# Patient Record
Sex: Female | Born: 1955
Health system: Southern US, Community
[De-identification: ages and names within clinical notes are randomized; demographics above are authoritative.]

## PROBLEM LIST (undated history)

## (undated) DIAGNOSIS — T7840XA Allergy, unspecified, initial encounter: Secondary | ICD-10-CM

## (undated) DIAGNOSIS — E785 Hyperlipidemia, unspecified: Secondary | ICD-10-CM

## (undated) DIAGNOSIS — F419 Anxiety disorder, unspecified: Secondary | ICD-10-CM

## (undated) DIAGNOSIS — A159 Respiratory tuberculosis unspecified: Secondary | ICD-10-CM

## (undated) HISTORY — DX: Allergy, unspecified, initial encounter: T78.40XA

## (undated) HISTORY — DX: Anxiety disorder, unspecified: F41.9

## (undated) HISTORY — PX: ABDOMINAL HYSTERECTOMY: SHX81

## (undated) HISTORY — DX: Respiratory tuberculosis unspecified: A15.9

## (undated) HISTORY — PX: TONSILLECTOMY: SUR1361

## (undated) HISTORY — DX: Hyperlipidemia, unspecified: E78.5

---

## 2006-12-13 ENCOUNTER — Ambulatory Visit: Payer: Self-pay | Admitting: Family Medicine

## 2011-12-09 ENCOUNTER — Ambulatory Visit: Payer: Self-pay | Admitting: Family Medicine

## 2017-03-21 ENCOUNTER — Ambulatory Visit
Admission: RE | Admit: 2017-03-21 | Discharge: 2017-03-21 | Disposition: A | Discharge status: Home / Self Care | Payer: Managed Care, Other (non HMO) | Source: Ambulatory Visit | Attending: Physician Assistant | Admitting: Physician Assistant

## 2017-03-21 ENCOUNTER — Encounter: Payer: Self-pay | Admitting: Physician Assistant

## 2017-03-21 ENCOUNTER — Ambulatory Visit (INDEPENDENT_AMBULATORY_CARE_PROVIDER_SITE_OTHER): Payer: Managed Care, Other (non HMO) | Admitting: Physician Assistant

## 2017-03-21 VITALS — BP 132/68 | HR 95 | Resp 16 | Ht 63.5 in | Wt 132.0 lb

## 2017-03-21 DIAGNOSIS — R21 Rash and other nonspecific skin eruption: Secondary | ICD-10-CM

## 2017-03-21 DIAGNOSIS — Z8611 Personal history of tuberculosis: Secondary | ICD-10-CM

## 2017-03-21 DIAGNOSIS — N83209 Unspecified ovarian cyst, unspecified side: Secondary | ICD-10-CM | POA: Insufficient documentation

## 2017-03-21 DIAGNOSIS — A159 Respiratory tuberculosis unspecified: Secondary | ICD-10-CM | POA: Insufficient documentation

## 2017-03-21 DIAGNOSIS — R509 Fever, unspecified: Secondary | ICD-10-CM | POA: Diagnosis not present

## 2017-03-21 DIAGNOSIS — G4700 Insomnia, unspecified: Secondary | ICD-10-CM | POA: Insufficient documentation

## 2017-03-21 MED ORDER — DOXYCYCLINE HYCLATE 100 MG PO TABS: 100.0000 mg | ORAL_TABLET | Freq: Two times a day (BID) | ORAL | 0 refills | Status: DC

## 2017-03-21 MED ORDER — DOXYCYCLINE MONOHYDRATE 25 MG/5ML PO SUSR: 100.0000 mg | Freq: Two times a day (BID) | ORAL | 0 refills | Status: AC

## 2017-03-21 NOTE — Patient Instructions (Signed)
Tuberculosis Tuberculosis (TB) is an infection that harms body tissues. TB usually affects your lungs but can affect other parts of your body. There are two stages of TB:  Active TB. This means you have the symptoms of TB, and it can easily spread from person to person (contagious).  Latent TB. This means you do not have any symptoms of TB, and you are not contagious.  It is important to get treatment no matter what type of TB you have. Follow these instructions at home:  Take your antibiotic medicine as told by your doctor. Finish it all even if you start to feel better.  Keep all follow-up visits as told by your doctor. This is important.  Tell your doctor about all of the people you live with or with whom you have close contact. Your doctor or the Department of Health may talk to these people about being tested for TB.  Rest as needed.  Do not use any tobacco products, including cigarettes, chewing tobacco, or electronic cigarettes. If you need help quitting, ask your doctor.  Avoid close contact with others, especially infants and older people. Do this until your doctor says you will no longer spread TB.  Cover your mouth and nose when you cough or sneeze. Get rid of used tissues as told by your doctor.  Wash your hands often with soap and water.  Do not go back to work or school until your doctor says it is okay. Contact a doctor if:  You have new symptoms.  You are not hungry.  You feel sick to your stomach (nauseous) or throw up (vomit).  Your pee (urine) is dark yellow.  Your skin or the white part of your eyes turns a yellowish color.  Your symptoms do not go away or get worse.  You have a fever. Get help right away if:  You have chest pain.  You cough up blood.  You have trouble breathing or feel short of breath.  You have a headache or stiff neck. This information is not intended to replace advice given to you by your health care provider. Make sure you  discuss any questions you have with your health care provider. Document Released: 06/12/2009 Document Revised: 04/17/2016 Document Reviewed: 11/20/2013 Elsevier Interactive Patient Education  2018 Elsevier Inc.  

## 2017-03-21 NOTE — Progress Notes (Signed)
Patient: Terri Weaver Female    DOB: 1956/05/14   61 y.o.   MRN: [id number] Visit Date: 03/21/2017  Today's Provider: Trinna Post, PA-C   Chief Complaint  Patient presents with  . Rash   Subjective:      Terri Weaver is a 61 y/o woman with history of TB in 1993 presenting today with rash ongoing for 5 days. She says the rash appeared suddenly as red splotches. It is not itchy or painful. It is all over her body. Is not on her palms or soles. No new lotions or skin products. She has not had any URI symptoms, urinary symptoms. She did have a fever of 102.6 x 3 days earlier this week. No cough, night sweats, chills. No myalgias right now[place] not [place] tick bite.  Rash  This is a new problem. The current episode started in the past 7 days. The problem has been gradually worsening since onset. The rash is diffuse. The rash is characterized by redness. She was exposed to nothing. Associated symptoms include a fever (Pt did have a temp of 102.6 started July 10 and lasted for three days.  Has not had a temp since.). Pertinent negatives include no fatigue.       Allergies  Allergen Reactions  . Chantix  [Varenicline] Itching  . Penicillins      Current Outpatient Prescriptions:  .  Calcium-Vitamin D-Vitamin K 500-100-40 MG-UNT-MCG CHEW, Chew by mouth., Disp: , Rfl:  .  loratadine (CLARITIN) 10 MG tablet, Take by mouth., Disp: , Rfl:  .  Multiple Vitamins-Minerals (MULTIVITAMIN GUMMIES ADULT) CHEW, Chew by mouth., Disp: , Rfl:   Review of Systems  Constitutional: Positive for fever (Pt did have a temp of 102.6 started July 10 and lasted for three days.  Has not had a temp since.). Negative for activity change, appetite change, chills, diaphoresis, fatigue and unexpected weight change.  HENT: Negative for trouble swallowing (   ).   Respiratory: Negative.   Cardiovascular: Negative.   Gastrointestinal: Negative.     Musculoskeletal: Negative for arthralgias, back pain, gait problem, joint swelling, myalgias, neck pain and neck stiffness.  Skin: Positive for rash. Negative for color change, pallor and wound.  Neurological: Negative for dizziness, light-headedness and headaches.  Hematological: Does not bruise/bleed easily.    Social History  Substance Use Topics  . Smoking status: Current Every Day Smoker    Types: Cigarettes  . Smokeless tobacco: Never Used  . Alcohol use No   Objective:   BP 132/68 (BP Location: Right Arm, Patient Position: Sitting, Cuff Size: Normal)   Pulse 95   Resp 16   Ht 5' 3.5" (1.613 m)   Wt 132 lb (59.9 kg)   SpO2 97%   BMI 23.02 kg/m  Vitals:   03/21/17 1109  BP: 132/68  Pulse: 95  Resp: 16  SpO2: 97%  Weight: 132 lb (59.9 kg)  Height: 5' 3.5" (1.613 m)     Physical Exam  Constitutional: She is oriented to person, place, and time. She appears well-developed and well-nourished.  Neck: Neck supple.  Cardiovascular: Normal rate and regular rhythm.   Pulmonary/Chest: Effort normal and breath sounds normal.  Lymphadenopathy:    She has no cervical adenopathy.  Neurological: She is alert and oriented to person, place, and time.  Skin: Skin is warm and dry. Rash noted. There is erythema.  Erythematous lesions distributed across all extremities and  trunk. Spare palms and soles. They are macular, blanchable, and nontender to palpation.  Psychiatric: She has a normal mood and affect. Her behavior is normal.   Media Information     Document Information   Photos  Right arm  03/21/2017 11:32  Attached To:  Office Visit on 03/21/17 with Trinna Post, PA-C  Source Information   Paulene Floor  [facility]   Media Information     Document Information   Photos  Back  03/21/2017 11:32  Attached To:  Office Visit on 03/21/17 with Trinna Post, PA-C  Source Information   Paulene Floor  [facility]    Media Information     Document Information   Photos  Left leg  03/21/2017 11:32  Attached To:  Office Visit on 03/21/17 with Trinna Post, PA-C  Source Information   Paulene Floor  [facility]         Assessment & Plan:     1. Skin rash  Not nodular, tender, or vasculitic in appearance like erythema nodosum. Possibly erythema migrans 2/2 tickborne illness. Did have fever, and does have history of TB. Will evaluate as below and cover for doxycycline for tick borne illness while labs are pending. Patient works in [employer] care, work note provided until labs come back.   - Comprehensive metabolic panel - CBC with Differential/Platelet - HIV antibody (with reflex) - Rocky mtn spotted fvr abs pnl(IgG+IgM) - Lyme Ab/Western Blot Reflex - DG Chest 2 View; Future - Quantiferon tb gold assay - doxycycline (VIBRAMYCIN) 25 MG/5ML SUSR; Take 20 mLs (100 mg total) by mouth 2 (two) times daily.  Dispense: 400 mL; Refill: 0  2. History of TB (tuberculosis)  - Comprehensive metabolic panel - CBC with Differential/Platelet - HIV antibody (with reflex) - Rocky mtn spotted fvr abs pnl(IgG+IgM) - Lyme Ab/Western Blot Reflex - DG Chest 2 View; Future - Quantiferon tb gold assay  3. Fever, unspecified fever cause  - Quantiferon tb gold assay - doxycycline (VIBRAMYCIN) 25 MG/5ML SUSR; Take 20 mLs (100 mg total) by mouth 2 (two) times daily.  Dispense: 400 mL; Refill: 0  Return if symptoms worsen or fail to improve.  The entirety of the information documented in the History of Present Illness, Review of Systems and Physical Exam were personally obtained by me. Portions of this information were initially documented by Ashley Royalty, CMA and reviewed by me for thoroughness and accuracy.   I have spent 25 minutes with this patient, >50% of which was spent on counseling and coordination of care.      Trinna Post, PA-C  [facility]

## 2017-03-24 ENCOUNTER — Other Ambulatory Visit: Payer: Self-pay | Admitting: Physician Assistant

## 2017-03-24 DIAGNOSIS — R21 Rash and other nonspecific skin eruption: Secondary | ICD-10-CM

## 2017-03-24 NOTE — Progress Notes (Signed)
Referral placed. If patient provides an email and wants to activate her mychart, I can see if I can send the pictures I took in clinic to her if that would be helpful.

## 2017-03-24 NOTE — Progress Notes (Signed)
Patient advised. Her email is [email or web address] and I sent her text to activate mychart.

## 2017-03-25 LAB — LYME, WESTERN BLOT, SERUM (REFLEXED)
IgG P18 Ab.: ABSENT
IgG P28 Ab.: ABSENT
IgG P30 Ab.: ABSENT
IgG P39 Ab.: ABSENT
IgG P45 Ab.: ABSENT
IgG P58 Ab.: ABSENT
IgG P66 Ab.: ABSENT
IgG P93 Ab.: ABSENT
Lyme IgG Wb: NEGATIVE
Lyme IgM Wb: POSITIVE — AB

## 2017-03-25 LAB — COMPREHENSIVE METABOLIC PANEL
ALT: 13 IU/L (ref 0–32)
AST: 18 IU/L (ref 0–40)
Albumin/Globulin Ratio: 1.7 (ref 1.2–2.2)
Albumin: 4.3 g/dL (ref 3.5–5.5)
Alkaline Phosphatase: 89 IU/L (ref 39–117)
BUN/Creatinine Ratio: 17 (ref 9–23)
BUN: 12 mg/dL (ref 6–24)
Bilirubin Total: 0.3 mg/dL (ref 0.0–1.2)
CO2: 25 mmol/L (ref 20–29)
Calcium: 9.4 mg/dL (ref 8.7–10.2)
Chloride: 102 mmol/L (ref 96–106)
Creatinine, Ser: 0.71 mg/dL (ref 0.57–1.00)
GFR calc Af Amer: 108 mL/min/{1.73_m2} (ref >59)
GFR calc non Af Amer: 94 mL/min/{1.73_m2} (ref >59)
Globulin, Total: 2.5 g/dL (ref 1.5–4.5)
Glucose: 101 mg/dL — ABNORMAL HIGH (ref 65–99)
Potassium: 4.7 mmol/L (ref 3.5–5.2)
Sodium: 141 mmol/L (ref 134–144)
Total Protein: 6.8 g/dL (ref 6.0–8.5)

## 2017-03-25 LAB — CBC WITH DIFFERENTIAL/PLATELET
Basophils Absolute: 0.1 10*3/uL (ref 0.0–0.2)
Basos: 1 %
EOS (ABSOLUTE): 0.1 10*3/uL (ref 0.0–0.4)
Eos: 1 %
Hematocrit: 41.1 % (ref 34.0–46.6)
Hemoglobin: 13.8 g/dL (ref 11.1–15.9)
Immature Grans (Abs): 0 10*3/uL (ref 0.0–0.1)
Immature Granulocytes: 0 %
Lymphocytes Absolute: 1.7 10*3/uL (ref 0.7–3.1)
Lymphs: 20 %
MCH: 31.5 pg (ref 26.6–33.0)
MCHC: 33.6 g/dL (ref 31.5–35.7)
MCV: 94 fL (ref 79–97)
Monocytes Absolute: 0.7 10*3/uL (ref 0.1–0.9)
Monocytes: 9 %
Neutrophils Absolute: 5.9 10*3/uL (ref 1.4–7.0)
Neutrophils: 69 %
Platelets: 375 10*3/uL (ref 150–379)
RBC: 4.38 x10E6/uL (ref 3.77–5.28)
RDW: 14 % (ref 12.3–15.4)
WBC: 8.5 10*3/uL (ref 3.4–10.8)

## 2017-03-25 LAB — QUANTIFERON IN TUBE
QFT TB AG MINUS NIL VALUE: 0 IU/mL
QUANTIFERON MITOGEN VALUE: >10 IU/mL
QUANTIFERON TB AG VALUE: 0.13 IU/mL
QUANTIFERON TB GOLD: NEGATIVE
Quantiferon Nil Value: 0.13 IU/mL

## 2017-03-25 LAB — LYME AB/WESTERN BLOT REFLEX
LYME DISEASE AB, QUANT, IGM: 8.09 index — ABNORMAL HIGH (ref 0.00–0.79)
Lyme IgG/IgM Ab: 2.17 {ISR} — ABNORMAL HIGH (ref 0.00–0.90)

## 2017-03-25 LAB — ROCKY MTN SPOTTED FVR ABS PNL(IGG+IGM)
RMSF IgG: NEGATIVE
RMSF IgM: 0.79 index (ref 0.00–0.89)

## 2017-03-25 LAB — QUANTIFERON TB GOLD ASSAY (BLOOD)

## 2017-03-25 LAB — HIV ANTIBODY (ROUTINE TESTING W REFLEX): HIV Screen 4th Generation wRfx: NONREACTIVE

## 2017-03-27 ENCOUNTER — Other Ambulatory Visit: Payer: Self-pay | Admitting: Physician Assistant

## 2017-03-27 DIAGNOSIS — A6920 Lyme disease, unspecified: Secondary | ICD-10-CM

## 2017-03-27 MED ORDER — DOXYCYCLINE HYCLATE 100 MG PO TABS: 100.0000 mg | ORAL_TABLET | Freq: Two times a day (BID) | ORAL | 0 refills | Status: AC

## 2017-04-20 ENCOUNTER — Telehealth: Payer: Self-pay | Admitting: Physician Assistant

## 2017-04-20 NOTE — Telephone Encounter (Signed)
Noted thank you

## 2017-04-20 NOTE — Telephone Encounter (Signed)
FYI --Pt refused appointment to dermatologist stating that her rash has gotten better

## 2018-03-05 ENCOUNTER — Encounter: Payer: Self-pay | Admitting: Physician Assistant

## 2018-03-05 ENCOUNTER — Ambulatory Visit (INDEPENDENT_AMBULATORY_CARE_PROVIDER_SITE_OTHER): Payer: Managed Care, Other (non HMO) | Admitting: Physician Assistant

## 2018-03-05 VITALS — BP 110/68 | HR 86 | Temp 98.1°F | Resp 16 | Wt 134.6 lb

## 2018-03-05 DIAGNOSIS — G5601 Carpal tunnel syndrome, right upper limb: Secondary | ICD-10-CM

## 2018-03-05 DIAGNOSIS — G4700 Insomnia, unspecified: Secondary | ICD-10-CM

## 2018-03-05 MED ORDER — TRAZODONE HCL 50 MG PO TABS: 25.0000 mg | ORAL_TABLET | Freq: Every evening | ORAL | 3 refills | Status: DC | PRN

## 2018-03-05 MED ORDER — MELOXICAM 15 MG PO TABS: ORAL_TABLET | ORAL | 0 refills | Status: DC

## 2018-03-05 NOTE — Progress Notes (Signed)
Patient: Terri Weaver Female    DOB: 1956-04-09   62 y.o.   MRN: [id number] Visit Date: 03/05/2018  Today's Provider: Trinna Post, PA-C   Chief Complaint  Patient presents with  . Arm Pain   Subjective:    HPI Patient here today C/O right arm pain, numbness, and swelling x's one month. Patient reports it maybe carpal tunnel, patient reports taking Ibuprofen and ice to help with symptoms. Patient reports mild symptom control with treatments. Worse with driving and typing. Pain in right 2-4 fingers. Requesting something for pain because it disrupts her sleep.  Also reports long standing history of insomnia. Has never gotten more than 3-4 hours of sleep. Melatonin ineffective.     Allergies  Allergen Reactions  . Chantix  [Varenicline] Itching  . Penicillins      Current Outpatient Medications:  .  BIOTIN PO, Take by mouth., Disp: , Rfl:  .  Calcium-Vitamin D-Vitamin K 503-546-56 MG-UNT-MCG CHEW, Chew by mouth., Disp: , Rfl:  .  COLLAGEN PO, Take by mouth., Disp: , Rfl:  .  loratadine (CLARITIN) 10 MG tablet, Take by mouth., Disp: , Rfl:  .  Multiple Vitamins-Minerals (MULTIVITAMIN GUMMIES ADULT) CHEW, Chew by mouth., Disp: , Rfl:  .  meloxicam (MOBIC) 15 MG tablet, One tab a day as needed for pain., Disp: 30 tablet, Rfl: 0 .  traZODone (DESYREL) 50 MG tablet, Take 0.5-1 tablets (25-50 mg total) by mouth at bedtime as needed for sleep., Disp: 30 tablet, Rfl: 3  Review of Systems  Constitutional: Negative.   Respiratory: Negative.   Cardiovascular: Negative.   Musculoskeletal: Positive for arthralgias and myalgias.    Social History   Tobacco Use  . Smoking status: Current Every Day Smoker    Types: Cigarettes  . Smokeless tobacco: Never Used  Substance Use Topics  . Alcohol use: No   Objective:   BP 110/68 (BP Location: Left Arm, Patient Position: Sitting, Cuff Size: Normal)   Pulse 86   Temp 98.1 F (36.7 C) (Oral)   Resp 16   Wt 134 lb 9.6 oz  (61.1 kg)   SpO2 97%   BMI 23.47 kg/m  Vitals:   03/05/18 1313  BP: 110/68  Pulse: 86  Resp: 16  Temp: 98.1 F (36.7 C)  TempSrc: Oral  SpO2: 97%  Weight: 134 lb 9.6 oz (61.1 kg)     Physical Exam  Constitutional: She is oriented to person, place, and time. She appears well-developed and well-nourished.  Cardiovascular: Normal rate.  Pulmonary/Chest: Effort normal.  Musculoskeletal: Normal range of motion. She exhibits no edema, tenderness or deformity.  Phalens positive right hand.   Neurological: She is alert and oriented to person, place, and time.  Skin: Skin is warm and dry.  Psychiatric: She has a normal mood and affect. Her behavior is normal.        Assessment & Plan:     1. Carpal tunnel syndrome, right  Counseled on progressive treatment for carpal tunnel. Anti-inflammatories and cock up wrist splint x 6 weeks. If she fails conservative treatment she may need steroid shot or carpal tunnel release surgery from orthopedist.   - meloxicam (MOBIC) 15 MG tablet; One tab a day as needed for pain.  Dispense: 30 tablet; Refill: 0  2. Insomnia, unspecified type  Try as below. Schedule physical, will check on medication compliance then.  - traZODone (DESYREL) 50 MG tablet; Take 0.5-1 tablets (25-50 mg total) by mouth at bedtime  as needed for sleep.  Dispense: 30 tablet; Refill: 3  Return in about 1 month (around 04/05/2018) for cpe.  The entirety of the information documented in the History of Present Illness, Review of Systems and Physical Exam were personally obtained by me. Portions of this information were initially documented by Lynford Humphrey, CMA and reviewed by me for thoroughness and accuracy.         Trinna Post, PA-C  [facility]

## 2018-03-05 NOTE — Patient Instructions (Addendum)
COCK UP WRIST SPLINT    Carpal Tunnel Syndrome Carpal tunnel syndrome is a condition that causes pain in your hand and arm. The carpal tunnel is a narrow area that is on the palm side of your wrist. Repeated wrist motion or certain diseases may cause swelling in the tunnel. This swelling can pinch the main nerve in the wrist (median nerve). Follow these instructions at home: If you have a splint:  Wear it as told by your doctor. Remove it only as told by your doctor.  Loosen the splint if your fingers: ? Become numb and tingle. ? Turn blue and cold.  Keep the splint clean and dry. General instructions  Take over-the-counter and prescription medicines only as told by your doctor.  Rest your wrist from any activity that may be causing your pain. If needed, talk to your employer about changes that can be made in your work, such as getting a wrist pad to use while typing.  If directed, apply ice to the painful area: ? Put ice in a plastic bag. ? Place a towel between your skin and the bag. ? Leave the ice on for 20 minutes, 2-3 times per day.  Keep all follow-up visits as told by your doctor. This is important.  Do any exercises as told by your doctor, physical therapist, or occupational therapist. Contact a doctor if:  You have new symptoms.  Medicine does not help your pain.  Your symptoms get worse. This information is not intended to replace advice given to you by your health care provider. Make sure you discuss any questions you have with your health care provider. Document Released: 08/04/2011 Document Revised: 01/21/2016 Document Reviewed: 12/31/2014 Elsevier Interactive Patient Education  [employer].

## 2018-04-01 ENCOUNTER — Other Ambulatory Visit: Payer: Self-pay | Admitting: Physician Assistant

## 2018-04-01 DIAGNOSIS — G5601 Carpal tunnel syndrome, right upper limb: Secondary | ICD-10-CM

## 2018-04-02 NOTE — Telephone Encounter (Signed)
Gave this for carpal tunnel, patient has had it for a month. Do not recommend this daily. Will give for two weeks, but she should see ortho for steroid injection if not improving. Does she want referral?

## 2018-04-06 ENCOUNTER — Other Ambulatory Visit: Payer: Self-pay

## 2018-04-06 ENCOUNTER — Encounter: Payer: Self-pay | Admitting: Physician Assistant

## 2018-04-06 ENCOUNTER — Ambulatory Visit (INDEPENDENT_AMBULATORY_CARE_PROVIDER_SITE_OTHER): Payer: Managed Care, Other (non HMO) | Admitting: Physician Assistant

## 2018-04-06 VITALS — BP 122/66 | HR 104 | Temp 98.2°F | Ht 63.5 in | Wt 134.0 lb

## 2018-04-06 DIAGNOSIS — Z13220 Encounter for screening for lipoid disorders: Secondary | ICD-10-CM

## 2018-04-06 DIAGNOSIS — G4700 Insomnia, unspecified: Secondary | ICD-10-CM

## 2018-04-06 DIAGNOSIS — Z0000 Encounter for general adult medical examination without abnormal findings: Secondary | ICD-10-CM | POA: Diagnosis not present

## 2018-04-06 DIAGNOSIS — Z1159 Encounter for screening for other viral diseases: Secondary | ICD-10-CM | POA: Diagnosis not present

## 2018-04-06 DIAGNOSIS — Z1211 Encounter for screening for malignant neoplasm of colon: Secondary | ICD-10-CM | POA: Diagnosis not present

## 2018-04-06 DIAGNOSIS — Z131 Encounter for screening for diabetes mellitus: Secondary | ICD-10-CM

## 2018-04-06 DIAGNOSIS — Z130 Encounter for screening for diseases of the blood and blood-forming organs and certain disorders involving the immune mechanism: Secondary | ICD-10-CM

## 2018-04-06 DIAGNOSIS — Z1231 Encounter for screening mammogram for malignant neoplasm of breast: Secondary | ICD-10-CM | POA: Diagnosis not present

## 2018-04-06 DIAGNOSIS — Z1239 Encounter for other screening for malignant neoplasm of breast: Secondary | ICD-10-CM

## 2018-04-06 DIAGNOSIS — Z1329 Encounter for screening for other suspected endocrine disorder: Secondary | ICD-10-CM

## 2018-04-06 DIAGNOSIS — G5601 Carpal tunnel syndrome, right upper limb: Secondary | ICD-10-CM

## 2018-04-06 NOTE — Progress Notes (Signed)
Patient: Terri Weaver, Female    DOB: 12/26/1955, 62 y.o.   MRN: [id number] Visit Date: 04/06/2018  Today's Provider: Trinna Post, PA-C   Chief Complaint  Patient presents with  . Annual Exam   Subjective:    Annual physical exam Terri Weaver is a 62 y.o. female who presents today for health maintenance and complete physical. She feels well. She reports exercising some. She reports she is sleeping well.  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 Continue.   Carpal Tunnel Syndrome: improved some with brace and rest. Continues to type frequently at job.  -----------------------------------------------------------------   Review of Systems  Constitutional: Negative.   HENT: Negative.   Eyes: Negative.   Respiratory: Negative.   Cardiovascular: Negative.   Gastrointestinal: Negative.   Endocrine: Negative.   Genitourinary: Negative.   Musculoskeletal: Negative.   Skin: Negative.   Allergic/Immunologic: Negative.   Neurological: Negative.   Hematological: Negative.   Psychiatric/Behavioral: Negative.     Social History      She  reports that she has been smoking cigarettes. She has never used smokeless tobacco. She reports that she does not drink alcohol or use drugs.       Social History   Socioeconomic History  . Marital status: Married    Spouse name: Not on file  . Number of children: Not on file  . Years of education: Not on file  . Highest education level: Not on file  Occupational History  . Not on file  Social Needs  . Financial resource strain: Not on file  . Food insecurity:    Worry: Not on file    Inability: Not on file  . Transportation needs:    Medical: Not on file    Non-medical: Not on file  Tobacco Use  . Smoking status: Current Every Day Smoker    Types:  Cigarettes  . Smokeless tobacco: Never Used  Substance and Sexual Activity  . Alcohol use: No  . Drug use: No  . Sexual activity: Yes    Partners: Male  Lifestyle  . Physical activity:    Days per week: Not on file    Minutes per session: Not on file  . Stress: Not on file  Relationships  . Social connections:    Talks on phone: Not on file    Gets together: Not on file    Attends religious service: Not on file    Active member of club or organization: Not on file    Attends meetings of clubs or organizations: Not on file    Relationship status: Not on file  Other Topics Concern  . Not on file  Social History Narrative  . Not on file    History reviewed. No pertinent past medical history.   Patient Active Problem List   Diagnosis Date Noted  . Lyme disease 03/27/2017  . Tuberculosis 03/21/2017  . Cyst of ovary 03/21/2017  . Cannot sleep 03/21/2017    Past Surgical History:  Procedure Laterality Date  . ABDOMINAL HYSTERECTOMY    . TONSILLECTOMY      Family History        Family Status  Relation Name Status  . Mother  Deceased  . Father  Deceased  . Sister  Alive  . Mat Aunt  (Not Specified)  Her family history includes Breast cancer in her maternal aunt; COPD in her father; Heart attack in her sister; Prostate cancer in her father; Stomach cancer in her mother.      Allergies  Allergen Reactions  . Chantix  [Varenicline] Itching  . Penicillins      Current Outpatient Medications:  .  BIOTIN PO, Take by mouth., Disp: , Rfl:  .  Calcium-Vitamin D-Vitamin K 751-700-17 MG-UNT-MCG CHEW, Chew by mouth., Disp: , Rfl:  .  COLLAGEN PO, Take by mouth., Disp: , Rfl:  .  loratadine (CLARITIN) 10 MG tablet, Take by mouth., Disp: , Rfl:  .  meloxicam (MOBIC) 15 MG tablet, TAKE 1 TABLET BY MOUTH EVERY DAY AS NEEDED FOR PAIN, Disp: 14 tablet, Rfl: 0 .  Multiple Vitamins-Minerals (MULTIVITAMIN GUMMIES ADULT) CHEW, Chew by mouth., Disp: , Rfl:  .  traZODone  (DESYREL) 50 MG tablet, Take 0.5-1 tablets (25-50 mg total) by mouth at bedtime as needed for sleep., Disp: 30 tablet, Rfl: 3   Patient Care Team: Paulene Floor as PCP - General (Physician Assistant)      Objective:   Vitals: BP 122/66 (BP Location: Left Arm, Patient Position: Sitting, Cuff Size: Normal)   Pulse (!) 104   Temp 98.2 F (36.8 C)   Ht 5' 3.5" (1.613 m)   Wt 134 lb (60.8 kg)   SpO2 93%   BMI 23.36 kg/m    Vitals:   04/06/18 0919  BP: 122/66  Pulse: (!) 104  Temp: 98.2 F (36.8 C)  SpO2: 93%  Weight: 134 lb (60.8 kg)  Height: 5' 3.5" (1.613 m)     Physical Exam  Constitutional: She is oriented to person, place, and time. She appears well-developed and well-nourished.  Cardiovascular: Normal rate and regular rhythm.  Pulmonary/Chest: Effort normal and breath sounds normal. Right breast exhibits no inverted nipple, no mass, no nipple discharge, no skin change and no tenderness. Left breast exhibits no inverted nipple, no mass, no nipple discharge, no skin change and no tenderness. No breast swelling, tenderness, discharge or bleeding. Breasts are symmetrical.  Abdominal: Soft. Bowel sounds are normal.  Neurological: She is alert and oriented to person, place, and time.  Skin: Skin is warm and dry.  Psychiatric: She has a normal mood and affect. Her behavior is normal.     Depression Screen PHQ 2/9 Scores 04/06/2018  PHQ - 2 Score 0  PHQ- 9 Score 0      Assessment & Plan:  1. Annual physical exam   2. Breast cancer screening  Counseled about self scheduling, [place] card provided.  - MM 3D SCREEN BREAST BILATERAL; Future  3. Colon cancer screening  - Ambulatory referral to Gastroenterology  4. Encounter for hepatitis C screening test for low risk patient  - Hepatitis C antibody  5. Lipid screening  - Lipid Profile  6. Thyroid disorder screening  - TSH  7. Screening for deficiency anemia  - CBC with Differential  8.  Diabetes mellitus screening  - Comprehensive Metabolic Panel (CMET)  9. Insomnia, unspecified type  Controlled on trazodone 25 - 50 mg nightly PRN. Continue.  10. Carpal tunnel syndrome of right wrist  Counseled about progressive interventions.  Return in about 1 year (around 04/07/2019) for cpe.  The entirety of the information documented in the History of Present Illness, Review of Systems and Physical Exam were personally obtained by me. Portions of this information were initially documented by Ashley Royalty, CMA and reviewed by me for  thoroughness and accuracy.    --------------------------------------------------------------------    Trinna Post, PA-C  [facility]

## 2018-04-06 NOTE — Patient Instructions (Signed)

## 2018-04-07 LAB — COMPREHENSIVE METABOLIC PANEL
ALT: 13 IU/L (ref 0–32)
AST: 17 IU/L (ref 0–40)
Albumin/Globulin Ratio: 2.3 — ABNORMAL HIGH (ref 1.2–2.2)
Albumin: 4.9 g/dL — ABNORMAL HIGH (ref 3.6–4.8)
Alkaline Phosphatase: 86 IU/L (ref 39–117)
BUN/Creatinine Ratio: 16 (ref 12–28)
BUN: 12 mg/dL (ref 8–27)
Bilirubin Total: 0.4 mg/dL (ref 0.0–1.2)
CO2: 23 mmol/L (ref 20–29)
Calcium: 9.9 mg/dL (ref 8.7–10.3)
Chloride: 101 mmol/L (ref 96–106)
Creatinine, Ser: 0.75 mg/dL (ref 0.57–1.00)
GFR calc Af Amer: 100 mL/min/{1.73_m2} (ref >59)
GFR calc non Af Amer: 87 mL/min/{1.73_m2} (ref >59)
Globulin, Total: 2.1 g/dL (ref 1.5–4.5)
Glucose: 112 mg/dL — ABNORMAL HIGH (ref 65–99)
Potassium: 4.4 mmol/L (ref 3.5–5.2)
Sodium: 141 mmol/L (ref 134–144)
Total Protein: 7 g/dL (ref 6.0–8.5)

## 2018-04-07 LAB — LIPID PANEL
Chol/HDL Ratio: 3.6 ratio (ref 0.0–4.4)
Cholesterol, Total: 264 mg/dL — ABNORMAL HIGH (ref 100–199)
HDL: 74 mg/dL (ref >39)
LDL Calculated: 176 mg/dL — ABNORMAL HIGH (ref 0–99)
Triglycerides: 71 mg/dL (ref 0–149)
VLDL Cholesterol Cal: 14 mg/dL (ref 5–40)

## 2018-04-07 LAB — CBC WITH DIFFERENTIAL/PLATELET
Basophils Absolute: 0.1 10*3/uL (ref 0.0–0.2)
Basos: 1 %
EOS (ABSOLUTE): 0.1 10*3/uL (ref 0.0–0.4)
Eos: 2 %
Hematocrit: 45.5 % (ref 34.0–46.6)
Hemoglobin: 15.4 g/dL (ref 11.1–15.9)
Immature Grans (Abs): 0 10*3/uL (ref 0.0–0.1)
Immature Granulocytes: 0 %
Lymphocytes Absolute: 2.1 10*3/uL (ref 0.7–3.1)
Lymphs: 25 %
MCH: 32.8 pg (ref 26.6–33.0)
MCHC: 33.8 g/dL (ref 31.5–35.7)
MCV: 97 fL (ref 79–97)
Monocytes Absolute: 0.6 10*3/uL (ref 0.1–0.9)
Monocytes: 7 %
Neutrophils Absolute: 5.6 10*3/uL (ref 1.4–7.0)
Neutrophils: 65 %
Platelets: 281 10*3/uL (ref 150–450)
RBC: 4.7 x10E6/uL (ref 3.77–5.28)
RDW: 13 % (ref 12.3–15.4)
WBC: 8.5 10*3/uL (ref 3.4–10.8)

## 2018-04-07 LAB — HEPATITIS C ANTIBODY: Hep C Virus Ab: <0.1 s/co ratio (ref 0.0–0.9)

## 2018-04-07 LAB — TSH: TSH: 0.375 u[IU]/mL — ABNORMAL LOW (ref 0.450–4.500)

## 2018-04-12 ENCOUNTER — Telehealth: Payer: Self-pay

## 2018-04-12 NOTE — Telephone Encounter (Signed)
LVM for pt to contact office to reschedule her colonoscopy from 04/23/18 due to schedule change.  Thanks [employer]

## 2018-04-16 ENCOUNTER — Telehealth: Payer: Self-pay

## 2018-04-16 NOTE — Telephone Encounter (Signed)
-----   Message from Trinna Post, [place] sent at 04/10/2018 12:20 PM EDT ----- Cholesterol elevated but doesn't require treatment. Hep C and CBC normal. TSH low, please add free T3 and T4 under dx low TSH. Glucose also slightly high, please add A1c under hyperglycemia. Thanks!

## 2018-04-16 NOTE — Telephone Encounter (Signed)
Pt advised.  Made apt for 07/20/2018 at 8am  Thanks,   -Mickel Baas

## 2018-04-16 NOTE — Telephone Encounter (Signed)
Notes recorded by Trinna Post, PA-C on 04/13/2018 at 3:08 PM EDT Her A1C is 5.8 which is prediabetic. Recommend increased exercise, reducing any sugary drinks if there are any. Follow up labs for thyroid are normal. Would like her to follow up in 3 months to recheck this. ------

## 2018-04-16 NOTE — Telephone Encounter (Signed)
Patient is returning call to Mickel Baas

## 2018-04-16 NOTE — Telephone Encounter (Signed)
Patient has been rescheduled from 08/26 to 08/29 due to schedule change for Dr. Marius Ditch. Kieth Brightly in Endo has been informed.  Thanks [place]

## 2018-04-16 NOTE — Telephone Encounter (Signed)
LMTCB 04/16/2018  Thanks,   -Mickel Baas

## 2018-04-21 LAB — SPECIMEN STATUS REPORT

## 2018-04-21 LAB — HGB A1C W/O EAG: Hgb A1c MFr Bld: 5.8 % — ABNORMAL HIGH (ref 4.8–5.6)

## 2018-04-21 LAB — T3, FREE: T3, Free: 3.2 pg/mL (ref 2.0–4.4)

## 2018-04-21 LAB — T4, FREE: Free T4: 1.32 ng/dL (ref 0.82–1.77)

## 2018-04-26 ENCOUNTER — Ambulatory Visit: Payer: Managed Care, Other (non HMO) | Admitting: Anesthesiology

## 2018-04-26 ENCOUNTER — Encounter
Admission: RE | Disposition: A | Discharge status: Home / Self Care | Payer: Self-pay | Source: Ambulatory Visit | Attending: Gastroenterology

## 2018-04-26 ENCOUNTER — Encounter: Payer: Self-pay | Admitting: *Deleted

## 2018-04-26 ENCOUNTER — Ambulatory Visit
Admission: RE | Admit: 2018-04-26 | Discharge: 2018-04-26 | Disposition: A | Discharge status: Home / Self Care | Payer: Managed Care, Other (non HMO) | Source: Ambulatory Visit | Attending: Gastroenterology | Admitting: Gastroenterology

## 2018-04-26 DIAGNOSIS — D123 Benign neoplasm of transverse colon: Secondary | ICD-10-CM | POA: Insufficient documentation

## 2018-04-26 DIAGNOSIS — D128 Benign neoplasm of rectum: Secondary | ICD-10-CM | POA: Diagnosis not present

## 2018-04-26 DIAGNOSIS — D120 Benign neoplasm of cecum: Secondary | ICD-10-CM | POA: Diagnosis not present

## 2018-04-26 DIAGNOSIS — D124 Benign neoplasm of descending colon: Secondary | ICD-10-CM | POA: Diagnosis not present

## 2018-04-26 DIAGNOSIS — K648 Other hemorrhoids: Secondary | ICD-10-CM | POA: Insufficient documentation

## 2018-04-26 DIAGNOSIS — K219 Gastro-esophageal reflux disease without esophagitis: Secondary | ICD-10-CM | POA: Insufficient documentation

## 2018-04-26 DIAGNOSIS — D125 Benign neoplasm of sigmoid colon: Secondary | ICD-10-CM | POA: Diagnosis not present

## 2018-04-26 DIAGNOSIS — K621 Rectal polyp: Secondary | ICD-10-CM | POA: Diagnosis not present

## 2018-04-26 DIAGNOSIS — Z1211 Encounter for screening for malignant neoplasm of colon: Secondary | ICD-10-CM

## 2018-04-26 DIAGNOSIS — D127 Benign neoplasm of rectosigmoid junction: Secondary | ICD-10-CM

## 2018-04-26 DIAGNOSIS — F17210 Nicotine dependence, cigarettes, uncomplicated: Secondary | ICD-10-CM | POA: Insufficient documentation

## 2018-04-26 HISTORY — PX: COLONOSCOPY WITH PROPOFOL: SHX5780

## 2018-04-26 SURGERY — COLONOSCOPY WITH PROPOFOL
Anesthesia: General

## 2018-04-26 MED ORDER — PROPOFOL 10 MG/ML IV BOLUS
INTRAVENOUS | Status: AC
  Filled 2018-04-26: qty 20

## 2018-04-26 MED ORDER — FENTANYL CITRATE (PF) 100 MCG/2ML IJ SOLN
INTRAMUSCULAR | Status: DC | PRN
  Administered 2018-04-26: 50 ug via INTRAVENOUS

## 2018-04-26 MED ORDER — SODIUM CHLORIDE 0.9 % IV SOLN
INTRAVENOUS | Status: DC
  Administered 2018-04-26: 1000 mL via INTRAVENOUS

## 2018-04-26 MED ORDER — MIDAZOLAM HCL 2 MG/2ML IJ SOLN
INTRAMUSCULAR | Status: DC | PRN
  Administered 2018-04-26: 2 mg via INTRAVENOUS

## 2018-04-26 MED ORDER — PROPOFOL 500 MG/50ML IV EMUL
INTRAVENOUS | Status: AC
  Filled 2018-04-26: qty 50

## 2018-04-26 MED ORDER — PROPOFOL 500 MG/50ML IV EMUL
INTRAVENOUS | Status: DC | PRN
  Administered 2018-04-26: 120 ug/kg/min via INTRAVENOUS

## 2018-04-26 MED ORDER — FENTANYL CITRATE (PF) 100 MCG/2ML IJ SOLN
INTRAMUSCULAR | Status: AC
  Filled 2018-04-26: qty 2

## 2018-04-26 MED ORDER — DIPHENHYDRAMINE HCL 50 MG/ML IJ SOLN
INTRAMUSCULAR | Status: AC
  Filled 2018-04-26: qty 1

## 2018-04-26 MED ORDER — MIDAZOLAM HCL 2 MG/2ML IJ SOLN
INTRAMUSCULAR | Status: AC
  Filled 2018-04-26: qty 2

## 2018-04-26 NOTE — Anesthesia Preprocedure Evaluation (Signed)
Anesthesia Evaluation  Patient identified by MRN, date of birth, ID band Patient awake    Reviewed: Allergy & Precautions, NPO status , Patient's Chart, lab work & pertinent test results  History of Anesthesia Complications Negative for: history of anesthetic complications  Airway Mallampati: II       Dental   Pulmonary neg sleep apnea, neg COPD, Current Smoker,           Cardiovascular (-) hypertension(-) Past MI and (-) CHF (-) dysrhythmias (-) Valvular Problems/Murmurs     Neuro/Psych neg Seizures    GI/Hepatic Neg liver ROS, neg GERD  ,  Endo/Other  neg diabetes  Renal/GU negative Renal ROS     Musculoskeletal   Abdominal   Peds  Hematology   Anesthesia Other Findings   Reproductive/Obstetrics                             Anesthesia Physical Anesthesia Plan  ASA: II  Anesthesia Plan: General   Post-op Pain Management:    Induction:   PONV Risk Score and Plan: 2  Airway Management Planned: Nasal Cannula  Additional Equipment:   Intra-op Plan:   Post-operative Plan:   Informed Consent: I have reviewed the patients History and Physical, chart, labs and discussed the procedure including the risks, benefits and alternatives for the proposed anesthesia with the patient or authorized representative who has indicated his/her understanding and acceptance.     Plan Discussed with:   Anesthesia Plan Comments:         Anesthesia Quick Evaluation

## 2018-04-26 NOTE — Op Note (Signed)
[facility] Gastroenterology Patient Name: Terri Weaver Procedure Date: 04/26/2018 12:20 PM MRN: [id number] Account #: [id number] Date of Birth: Nov 03, 1955 Admit Type: Outpatient Age: 62 Room: [facility] ENDO ROOM 1 Gender: Female Note Status: Finalized Procedure:            Colonoscopy Indications:          Screening for colorectal malignant neoplasm, This is                        the patient's first colonoscopy Providers:            Lin Landsman MD, MD Referring MD:         Wendee Beavers. Terrilee Croak (Referring MD) Medicines:            Monitored Anesthesia Care Complications:        No immediate complications. Estimated blood loss: None. Procedure:            Pre-Anesthesia Assessment:                       - Prior to the procedure, a History and Physical was                        performed, and patient medications and allergies were                        reviewed. The patient is competent. The risks and                        benefits of the procedure and the sedation options and                        risks were discussed with the patient. All questions                        were answered and informed consent was obtained.                        Patient identification and proposed procedure were                        verified by the physician, the nurse, the                        anesthesiologist, the anesthetist and the technician in                        the pre-procedure area in the procedure room in the                        endoscopy suite. Mental Status Examination: alert and                        oriented. Airway Examination: normal oropharyngeal                        airway and neck mobility. Respiratory Examination:                        clear to auscultation. CV Examination: normal.  Prophylactic Antibiotics: The patient does not require                        prophylactic antibiotics. Prior Anticoagulants: The               patient has taken no previous anticoagulant or                        antiplatelet agents. ASA Grade Assessment: II - A                        patient with mild systemic disease. After reviewing the                        risks and benefits, the patient was deemed in                        satisfactory condition to undergo the procedure. The                        anesthesia plan was to use monitored anesthesia care                        (MAC). Immediately prior to administration of                        medications, the patient was re-assessed for adequacy                        to receive sedatives. The heart rate, respiratory rate,                        oxygen saturations, blood pressure, adequacy of                        pulmonary ventilation, and response to care were                        monitored throughout the procedure. The physical status                        of the patient was re-assessed after the procedure.                       After obtaining informed consent, the colonoscope was                        passed under direct vision. Throughout the procedure,                        the patient's blood pressure, pulse, and oxygen                        saturations were monitored continuously. The                        Colonoscope was introduced through the anus and                        advanced to the the terminal ileum. The colonoscopy was  performed with moderate difficulty due to a tortuous                        colon and the patient's body habitus. Successful                        completion of the procedure was aided by applying                        abdominal pressure. The patient tolerated the procedure                        well. The quality of the bowel preparation was                        evaluated using the BBPS [facility] Bowel Preparation                        Scale) with scores of: Right Colon = 3, Transverse                         Colon = 3 and Left Colon = 3 (entire mucosa seen well                        with no residual staining, small fragments of stool or                        opaque liquid). The total BBPS score equals 9. Findings:      Skin tags were found on perianal exam.      17 sessile and semi-pedunculated polyps were found in the rectum (4),       recto-sigmoid colon (4), descending colon (4), transverse colon (3) and       cecum (2). The polyps were 2 to 7 mm in size. These polyps were removed       with forceps, cold snare and hot snare depending on the size. Resection       and retrieval were complete.      Non-bleeding internal hemorrhoids were found during retroflexion. The       hemorrhoids were large.      The exam was otherwise without abnormality.      The terminal ileum appeared normal. Impression:           - Perianal skin tags found on perianal exam.                       - 17 2 to 7 mm polyps in the rectum, at the                        recto-sigmoid colon, in the descending colon, in the                        transverse colon and in the cecum, removed with a hot                        snare. Resected and retrieved.                       - Non-bleeding internal hemorrhoids.                       -  The examination was otherwise normal. Recommendation:       - Discharge patient to home (with escort).                       - Resume previous diet today.                       - Continue present medications.                       - Await pathology results.                       - Repeat colonoscopy in 1 year for surveillance of                        multiple polyps.                       - Return to my office in 4 weeks. Procedure Code(s):    --- Professional ---                       [phone], Colonoscopy, flexible; with removal of tumor(s),                        polyp(s), or other lesion(s) by snare technique Diagnosis Code(s):    --- Professional ---                        Z12.11, Encounter for screening for malignant neoplasm                        of colon                       K62.1, Rectal polyp                       D12.7, Benign neoplasm of rectosigmoid junction                       D12.4, Benign neoplasm of descending colon                       D12.3, Benign neoplasm of transverse colon (hepatic                        flexure or splenic flexure)                       D12.0, Benign neoplasm of cecum                       K64.4, Residual hemorrhoidal skin tags                       K64.8, Other hemorrhoids CPT copyright 2017 American Medical Association. All rights reserved. The codes documented in this report are preliminary and upon coder review may  be revised to meet current compliance requirements. Dr. Ulyess Mort Lin Landsman MD, MD 04/26/2018 1:26:22 PM This report has been signed electronically. Number of Addenda: 0 Note Initiated On: 04/26/2018 12:20 PM Scope Withdrawal Time: 0 hours 40 minutes 14 seconds  Total Procedure Duration:  0 hours 48 minutes 10 seconds       [facility]

## 2018-04-26 NOTE — Anesthesia Post-op Follow-up Note (Signed)
Anesthesia QCDR form completed.        

## 2018-04-26 NOTE — Transfer of Care (Signed)
Immediate Anesthesia Transfer of Care Note  Patient: Terri Weaver  Procedure(s) Performed: COLONOSCOPY WITH PROPOFOL (N/A )  Patient Location: Endoscopy Unit  Anesthesia Type:General  Level of Consciousness: drowsy and patient cooperative  Airway & Oxygen Therapy: Patient Spontanous Breathing and Patient connected to nasal cannula oxygen  Post-op Assessment: Report given to RN and Post -op Vital signs reviewed and stable  Post vital signs: Reviewed and stable  Last Vitals:  Vitals Value Taken Time  BP 92/61 04/26/2018  1:26 PM  Temp 36.1 C 04/26/2018  1:24 PM  Pulse 87 04/26/2018  1:29 PM  Resp 17 04/26/2018  1:29 PM  SpO2 96 % 04/26/2018  1:29 PM  Vitals shown include unvalidated device data.  Last Pain:  Vitals:   04/26/18 1324  TempSrc: Tympanic  PainSc: 0-No pain         Complications: No apparent anesthesia complications

## 2018-04-26 NOTE — H&P (Signed)
Terri Darby, MD [address]  [place]  [place], [place] [address]  Main: [phone]  Fax: [phone] Pager: [phone]  Primary Care Physician:  Terri Post, PA-C Primary Gastroenterologist:  Dr. Cephas Weaver  Pre-Procedure History & Physical: HPI:  Terri Weaver is a 62 y.o. adult is here for an colonoscopy.   History reviewed. No pertinent past medical history.  Past Surgical History:  Procedure Laterality Date  . ABDOMINAL HYSTERECTOMY    . TONSILLECTOMY      Prior to Admission medications   Medication Sig Start Date End Date Taking? Authorizing Provider  BIOTIN PO Take by mouth.   Yes [provider]  Calcium-Vitamin D-Vitamin K [id number] MG-UNT-MCG CHEW Chew by mouth.   Yes [provider]  COLLAGEN PO Take by mouth.   Yes [provider]  loratadine (CLARITIN) 10 MG tablet Take by mouth.   Yes [provider]  Multiple Vitamins-Minerals (MULTIVITAMIN GUMMIES ADULT) CHEW Chew by mouth.   Yes [provider]  traZODone (DESYREL) 50 MG tablet Take 0.5-1 tablets (25-50 mg total) by mouth at bedtime as needed for sleep. 03/05/18  Yes Terri Post, PA-C  meloxicam (MOBIC) 15 MG tablet TAKE 1 TABLET BY MOUTH EVERY DAY AS NEEDED FOR PAIN Patient not taking: Reported on 04/26/2018 04/02/18   Terri Post, PA-C    Allergies as of 04/06/2018 - Review Complete 03/05/2018  Allergen Reaction Noted  . Chantix  [varenicline] Itching   . Penicillins      Family History  Problem Relation Age of Onset  . Stomach cancer Mother   . Prostate cancer Father   . COPD Father   . Heart attack Sister   . Breast cancer Maternal Aunt     Social History   Socioeconomic History  . Marital status: Married    Spouse name: Not on file  . Number of children: Not on file  . Years of education: Not on file  . Highest education level: Not on file  Occupational History  . Not on file  Social Needs  . Financial  resource strain: Not on file  . Food insecurity:    Worry: Not on file    Inability: Not on file  . Transportation needs:    Medical: Not on file    Non-medical: Not on file  Tobacco Use  . Smoking status: Current Every Day Smoker    Types: Cigarettes  . Smokeless tobacco: Never Used  Substance and Sexual Activity  . Alcohol use: No  . Drug use: No  . Sexual activity: Yes    Partners: Male  Lifestyle  . Physical activity:    Days per week: Not on file    Minutes per session: Not on file  . Stress: Not on file  Relationships  . Social connections:    Talks on phone: Not on file    Gets together: Not on file    Attends religious service: Not on file    Active member of club or organization: Not on file    Attends meetings of clubs or organizations: Not on file    Relationship status: Not on file  . Intimate partner violence:    Fear of current or ex partner: Not on file    Emotionally abused: Not on file    Physically abused: Not on file    Forced sexual activity: Not on file  Other Topics Concern  . Not on file  Social History Narrative  .  Not on file    Review of Systems: See HPI, otherwise negative ROS  Physical Exam: BP 136/75   Pulse 84   Temp (!) 96.4 F (35.8 C)   Resp 18   Ht 5\' 3"  (1.6 m)   Wt 59 kg   SpO2 100%   BMI 23.03 kg/m  General:   Alert,  pleasant and cooperative in NAD Head:  Normocephalic and atraumatic. Neck:  Supple; no masses or thyromegaly. Lungs:  Clear throughout to auscultation.    Heart:  Regular rate and rhythm. Abdomen:  Soft, nontender and nondistended. Normal bowel sounds, without guarding, and without rebound.   Neurologic:  Alert and  oriented x4;  grossly normal neurologically.  Impression/Plan: Terri Weaver is here for an colonoscopy to be performed for colon cancer screening  Risks, benefits, limitations, and alternatives regarding  colonoscopy have been reviewed with the patient.  Questions have been answered.   All parties agreeable.   Sherri Sear, MD  04/26/2018, 11:18 AM

## 2018-04-26 NOTE — Anesthesia Postprocedure Evaluation (Signed)
Anesthesia Post Note  Patient: Terri Weaver  Procedure(s) Performed: COLONOSCOPY WITH PROPOFOL (N/A )  Patient location during evaluation: Endoscopy Anesthesia Type: General Level of consciousness: awake and alert Pain management: pain level controlled Vital Signs Assessment: post-procedure vital signs reviewed and stable Respiratory status: spontaneous breathing, nonlabored ventilation, respiratory function stable and patient connected to nasal cannula oxygen Cardiovascular status: blood pressure returned to baseline and stable Postop Assessment: no apparent nausea or vomiting Anesthetic complications: no     Last Vitals:  Vitals:   04/26/18 1344 04/26/18 1354  BP: (!) 120/58 125/73  Pulse: 64 68  Resp: 20 14  Temp:    SpO2: 99% 100%    Last Pain:  Vitals:   04/26/18 1344  TempSrc:   PainSc: 0-No pain                 [name]

## 2018-04-26 NOTE — Anesthesia Procedure Notes (Signed)
Performed by: Cook-Martin, Jocsan Mcginley Pre-anesthesia Checklist: Patient identified, Emergency Drugs available, Suction available, Patient being monitored and Timeout performed Patient Re-evaluated:Patient Re-evaluated prior to induction Oxygen Delivery Method: Nasal cannula Preoxygenation: Pre-oxygenation with 100% oxygen Induction Type: IV induction Placement Confirmation: positive ETCO2 and CO2 detector       

## 2018-04-27 ENCOUNTER — Ambulatory Visit
Admission: RE | Admit: 2018-04-27 | Discharge: 2018-04-27 | Disposition: A | Discharge status: Home / Self Care | Payer: Managed Care, Other (non HMO) | Source: Ambulatory Visit | Attending: Physician Assistant | Admitting: Physician Assistant

## 2018-04-27 DIAGNOSIS — Z1231 Encounter for screening mammogram for malignant neoplasm of breast: Secondary | ICD-10-CM | POA: Diagnosis present

## 2018-04-27 DIAGNOSIS — Z1239 Encounter for other screening for malignant neoplasm of breast: Secondary | ICD-10-CM

## 2018-04-27 LAB — SURGICAL PATHOLOGY

## 2018-05-01 ENCOUNTER — Encounter: Payer: Self-pay | Admitting: Gastroenterology

## 2018-05-24 ENCOUNTER — Telehealth: Payer: Self-pay | Admitting: Gastroenterology

## 2018-05-24 NOTE — Telephone Encounter (Signed)
Spoke with patient and she wants to cancel follow up appt on 05/29/18

## 2018-05-24 NOTE — Telephone Encounter (Signed)
appt has been cancelled.

## 2018-05-24 NOTE — Telephone Encounter (Signed)
Pt has concerns abt her appt. Would like a call from the nurse

## 2018-05-29 ENCOUNTER — Ambulatory Visit: Payer: Managed Care, Other (non HMO) | Admitting: Gastroenterology

## 2018-06-20 ENCOUNTER — Other Ambulatory Visit: Payer: Self-pay | Admitting: Physician Assistant

## 2018-06-20 DIAGNOSIS — G4700 Insomnia, unspecified: Secondary | ICD-10-CM

## 2018-07-20 ENCOUNTER — Encounter: Payer: Self-pay | Admitting: Physician Assistant

## 2018-07-20 ENCOUNTER — Other Ambulatory Visit: Payer: Self-pay

## 2018-07-20 ENCOUNTER — Ambulatory Visit (INDEPENDENT_AMBULATORY_CARE_PROVIDER_SITE_OTHER): Payer: Managed Care, Other (non HMO) | Admitting: Physician Assistant

## 2018-07-20 VITALS — BP 120/70 | HR 89 | Temp 98.0°F | Resp 16 | Ht 63.0 in | Wt 137.4 lb

## 2018-07-20 DIAGNOSIS — Z23 Encounter for immunization: Secondary | ICD-10-CM

## 2018-07-20 DIAGNOSIS — R7303 Prediabetes: Secondary | ICD-10-CM | POA: Diagnosis not present

## 2018-07-20 DIAGNOSIS — D125 Benign neoplasm of sigmoid colon: Secondary | ICD-10-CM

## 2018-07-20 LAB — POCT GLYCOSYLATED HEMOGLOBIN (HGB A1C)
Est. average glucose Bld gHb Est-mCnc: 111
Hemoglobin A1C: 5.5 % (ref 4.0–5.6)

## 2018-07-20 NOTE — Patient Instructions (Signed)
Prediabetes Eating Plan Prediabetes-also called impaired glucose tolerance or impaired fasting glucose-is a condition that causes blood sugar (blood glucose) levels to be higher than normal. Following a healthy diet can help to keep prediabetes under control. It can also help to lower the risk of type 2 diabetes and heart disease, which are increased in people who have prediabetes. Along with regular exercise, a healthy diet:  Promotes weight loss.  Helps to control blood sugar levels.  Helps to improve the way that the body uses insulin.  What do I need to know about this eating plan?  Use the glycemic index (GI) to plan your meals. The index tells you how quickly a food will raise your blood sugar. Choose low-GI foods. These foods take a longer time to raise blood sugar.  Pay close attention to the amount of carbohydrates in the food that you eat. Carbohydrates increase blood sugar levels.  Keep track of how many calories you take in. Eating the right amount of calories will help you to achieve a healthy weight. Losing about 7 percent of your starting weight can help to prevent type 2 diabetes.  You may want to follow a Mediterranean diet. This diet includes a lot of vegetables, lean meats or fish, whole grains, fruits, and healthy oils and fats. What foods can I eat? Grains Whole grains, such as whole-wheat or whole-grain breads, crackers, cereals, and pasta. Unsweetened oatmeal. Bulgur. Barley. Quinoa. Brown rice. Corn or whole-wheat flour tortillas or taco shells. Vegetables Lettuce. Spinach. Peas. Beets. Cauliflower. Cabbage. Broccoli. Carrots. Tomatoes. Squash. Eggplant. Herbs. Peppers. Onions. Cucumbers. Brussels sprouts. Fruits Berries. Bananas. Apples. Oranges. Grapes. Papaya. Mango. Pomegranate. Kiwi. Grapefruit. Cherries. Meats and Other Protein Sources Seafood. Lean meats, such as chicken and turkey or lean cuts of pork and beef. Tofu. Eggs. Nuts. Beans. Dairy Low-fat or  fat-free dairy products, such as yogurt, cottage cheese, and cheese. Beverages Water. Tea. Coffee. Sugar-free or diet soda. Seltzer water. Milk. Milk alternatives, such as soy or almond milk. Condiments Mustard. Relish. Low-fat, low-sugar ketchup. Low-fat, low-sugar barbecue sauce. Low-fat or fat-free mayonnaise. Sweets and Desserts Sugar-free or low-fat pudding. Sugar-free or low-fat ice cream and other frozen treats. Fats and Oils Avocado. Walnuts. Olive oil. The items listed above may not be a complete list of recommended foods or beverages. Contact your dietitian for more options. What foods are not recommended? Grains Refined white flour and flour products, such as bread, pasta, snack foods, and cereals. Beverages Sweetened drinks, such as sweet iced tea and soda. Sweets and Desserts Baked goods, such as cake, cupcakes, pastries, cookies, and cheesecake. The items listed above may not be a complete list of foods and beverages to avoid. Contact your dietitian for more information. This information is not intended to replace advice given to you by your health care provider. Make sure you discuss any questions you have with your health care provider. Document Released: 12/30/2014 Document Revised: 01/21/2016 Document Reviewed: 09/10/2014 Elsevier Interactive Patient Education  2017 Elsevier Inc.  

## 2018-07-20 NOTE — Progress Notes (Signed)
Established Patient Office Visit  Subjective:  Patient ID: Terri Weaver, adult    DOB: 02/18/1956  Age: 62 y.o. MRN: [id number]  CC:  Chief Complaint  Patient presents with  . Follow-up    HPI [name] presents for   Prediabetes, Follow-up:   Lab Results  Component Value Date   HGBA1C 5.5 07/20/2018   HGBA1C 5.8 (H) 04/06/2018   GLUCOSE 112 (H) 04/06/2018   GLUCOSE 101 (H) 03/21/2017    Last seen for for this3 months ago.  Management since that visit includes no changes. Current symptoms include none and have been stable.  Weight trend: stable Prior visit with dietician: no Current diet: in general, a "healthy" diet   Current exercise: housecleaning and no regular exercise  Pertinent Labs:    Component Value Date/Time   CHOL 264 (H) 04/06/2018 0947   TRIG 71 04/06/2018 0947   CHOLHDL 3.6 04/06/2018 0947   CREATININE 0.75 04/06/2018 0947    Wt Readings from Last 3 Encounters:  07/20/18 137 lb 6.4 oz (62.3 kg)  04/26/18 130 lb (59 kg)  04/06/18 134 lb (60.8 kg)    Colon Polyps: In the interim from last visit she has had a colonoscopy where seventeen hyperplastic and adenomatous polyps were removed. She is to have a repeat colonoscopy in one year.   Shingrix: She asks about shingles vaccination today.   No past medical history on file.  Past Surgical History:  Procedure Laterality Date  . ABDOMINAL HYSTERECTOMY    . COLONOSCOPY WITH PROPOFOL N/A 04/26/2018   Procedure: COLONOSCOPY WITH PROPOFOL;  Surgeon: Lin Landsman, MD;  Location: [facility] ENDOSCOPY;  Service: Gastroenterology;  Laterality: N/A;  . TONSILLECTOMY      Family History  Problem Relation Age of Onset  . Stomach cancer Mother   . Prostate cancer Father   . COPD Father   . Heart attack Sister   . Breast cancer Maternal Aunt     Social History   Socioeconomic History  . Marital status: Married    Spouse name: Not on file  . Number of children: Not on file  .  Years of education: Not on file  . Highest education level: Not on file  Occupational History  . Not on file  Social Needs  . Financial resource strain: Not on file  . Food insecurity:    Worry: Not on file    Inability: Not on file  . Transportation needs:    Medical: Not on file    Non-medical: Not on file  Tobacco Use  . Smoking status: Current Every Day Smoker    Types: Cigarettes  . Smokeless tobacco: Never Used  Substance and Sexual Activity  . Alcohol use: No  . Drug use: No  . Sexual activity: Yes    Partners: Male  Lifestyle  . Physical activity:    Days per week: Not on file    Minutes per session: Not on file  . Stress: Not on file  Relationships  . Social connections:    Talks on phone: Not on file    Gets together: Not on file    Attends religious service: Not on file    Active member of club or organization: Not on file    Attends meetings of clubs or organizations: Not on file    Relationship status: Not on file  . Intimate partner violence:    Fear of current or ex partner: Not on file    Emotionally abused:  Not on file    Physically abused: Not on file    Forced sexual activity: Not on file  Other Topics Concern  . Not on file  Social History Narrative  . Not on file    Outpatient Medications Prior to Visit  Medication Sig Dispense Refill  . BIOTIN PO Take by mouth.    . Calcium-Vitamin D-Vitamin K [id number] MG-UNT-MCG CHEW Chew by mouth.    . COLLAGEN PO Take by mouth.    . loratadine (CLARITIN) 10 MG tablet Take by mouth.    . Multiple Vitamins-Minerals (MULTIVITAMIN GUMMIES ADULT) CHEW Chew by mouth.    . traZODone (DESYREL) 50 MG tablet TAKE 1/2 TO 1 TABLET (25-50 MG TOTAL) BY MOUTH AT BEDTIME AS NEEDED FOR SLEEP. 30 tablet 3   No facility-administered medications prior to visit.     Allergies  Allergen Reactions  . Chantix  [Varenicline] Itching  . Penicillins     ROS Review of Systems  Constitutional: Negative.   HENT:  Negative.   Eyes: Negative.   Respiratory: Negative.   Cardiovascular: Negative.   Endocrine: Negative.       Objective:    Physical Exam  Cardiovascular: Normal rate and regular rhythm.  Pulmonary/Chest: Effort normal and breath sounds normal.  Psychiatric: She has a normal mood and affect. Her behavior is normal.    BP 120/70 (BP Location: Left Arm, Patient Position: Sitting, Cuff Size: Normal)   Pulse 89   Temp 98 F (36.7 C) (Oral)   Resp 16   Ht 5\' 3"  (1.6 m)   Wt 137 lb 6.4 oz (62.3 kg)   SpO2 99%   BMI 24.34 kg/m  Wt Readings from Last 3 Encounters:  07/20/18 137 lb 6.4 oz (62.3 kg)  04/26/18 130 lb (59 kg)  04/06/18 134 lb (60.8 kg)     Health Maintenance Due  Topic Date Due  . TETANUS/TDAP  12/05/2016  . INFLUENZA VACCINE  03/29/2018    There are no preventive care reminders to display for this patient.  Lab Results  Component Value Date   TSH 0.375 (L) 04/06/2018   Lab Results  Component Value Date   WBC 8.5 04/06/2018   HGB 15.4 04/06/2018   HCT 45.5 04/06/2018   MCV 97 04/06/2018   PLT 281 04/06/2018   Lab Results  Component Value Date   NA 141 04/06/2018   K 4.4 04/06/2018   CO2 23 04/06/2018   GLUCOSE 112 (H) 04/06/2018   BUN 12 04/06/2018   CREATININE 0.75 04/06/2018   BILITOT 0.4 04/06/2018   ALKPHOS 86 04/06/2018   AST 17 04/06/2018   ALT 13 04/06/2018   PROT 7.0 04/06/2018   ALBUMIN 4.9 (H) 04/06/2018   CALCIUM 9.9 04/06/2018   Lab Results  Component Value Date   CHOL 264 (H) 04/06/2018   Lab Results  Component Value Date   HDL 74 04/06/2018   Lab Results  Component Value Date   LDLCALC 176 (H) 04/06/2018   Lab Results  Component Value Date   TRIG 71 04/06/2018   Lab Results  Component Value Date   CHOLHDL 3.6 04/06/2018   Lab Results  Component Value Date   HGBA1C 5.5 07/20/2018      Assessment & Plan:  1. Prediabetes  Improved today, counseled on continued diet and exercise.  - POCT glycosylated  hemoglobin (Hb A1C)  2. Adenomatous polyp of sigmoid colon  Repeat colonoscopy 1 year.  3. Need for shingles vaccine  Advised to check  with insurance where it will pay.  Follow-up: Return in about 9 months (around 04/20/2019) for CPE.   The entirety of the information documented in the History of Present Illness, Review of Systems and Physical Exam were personally obtained by me. Portions of this information were initially documented by Lynford Humphrey, CMA and reviewed by me for thoroughness and accuracy.    Trinna Post, PA-C

## 2018-07-20 NOTE — Telephone Encounter (Signed)
Patient states she forgot to ask for refills on Trazodone. She uses [facility]

## 2018-07-31 ENCOUNTER — Observation Stay: Payer: Managed Care, Other (non HMO) | Admitting: Anesthesiology

## 2018-07-31 ENCOUNTER — Other Ambulatory Visit: Payer: Self-pay

## 2018-07-31 ENCOUNTER — Emergency Department: Payer: Managed Care, Other (non HMO)

## 2018-07-31 ENCOUNTER — Observation Stay: Payer: Managed Care, Other (non HMO)

## 2018-07-31 ENCOUNTER — Encounter
Admission: EM | Disposition: A | Discharge status: Home / Self Care | Payer: Self-pay | Source: Home / Self Care | Attending: Surgery

## 2018-07-31 ENCOUNTER — Encounter: Payer: Self-pay | Admitting: Emergency Medicine

## 2018-07-31 ENCOUNTER — Observation Stay
Admission: EM | Admit: 2018-07-31 | Discharge: 2018-08-01 | Disposition: A | Discharge status: Home / Self Care | Payer: Managed Care, Other (non HMO) | Attending: Surgery | Admitting: Surgery

## 2018-07-31 DIAGNOSIS — Y92009 Unspecified place in unspecified non-institutional (private) residence as the place of occurrence of the external cause: Secondary | ICD-10-CM | POA: Insufficient documentation

## 2018-07-31 DIAGNOSIS — Z419 Encounter for procedure for purposes other than remedying health state, unspecified: Secondary | ICD-10-CM

## 2018-07-31 DIAGNOSIS — S82891A Other fracture of right lower leg, initial encounter for closed fracture: Secondary | ICD-10-CM | POA: Diagnosis present

## 2018-07-31 DIAGNOSIS — Z880 Allergy status to penicillin: Secondary | ICD-10-CM | POA: Insufficient documentation

## 2018-07-31 DIAGNOSIS — W07XXXA Fall from chair, initial encounter: Secondary | ICD-10-CM | POA: Insufficient documentation

## 2018-07-31 DIAGNOSIS — Z79899 Other long term (current) drug therapy: Secondary | ICD-10-CM | POA: Insufficient documentation

## 2018-07-31 DIAGNOSIS — Z7982 Long term (current) use of aspirin: Secondary | ICD-10-CM | POA: Diagnosis not present

## 2018-07-31 DIAGNOSIS — S82851A Displaced trimalleolar fracture of right lower leg, initial encounter for closed fracture: Secondary | ICD-10-CM | POA: Diagnosis present

## 2018-07-31 HISTORY — PX: ORIF ANKLE FRACTURE: SHX5408

## 2018-07-31 LAB — CBC WITH DIFFERENTIAL/PLATELET
Abs Immature Granulocytes: 0.09 10*3/uL — ABNORMAL HIGH (ref 0.00–0.07)
BASOS ABS: 0.1 10*3/uL (ref 0.0–0.1)
Basophils Relative: 0 %
EOS PCT: 0 %
Eosinophils Absolute: 0 10*3/uL (ref 0.0–0.5)
HEMATOCRIT: 43.1 % (ref 36.0–46.0)
Hemoglobin: 15 g/dL (ref 12.0–15.0)
Immature Granulocytes: 1 %
LYMPHS ABS: 0.8 10*3/uL (ref 0.7–4.0)
Lymphocytes Relative: 6 %
MCH: 33 pg (ref 26.0–34.0)
MCHC: 34.8 g/dL (ref 30.0–36.0)
MCV: 94.7 fL (ref 80.0–100.0)
Monocytes Absolute: 0.9 10*3/uL (ref 0.1–1.0)
Monocytes Relative: 6 %
Neutro Abs: 12.6 10*3/uL — ABNORMAL HIGH (ref 1.7–7.7)
Neutrophils Relative %: 87 %
Platelets: 269 10*3/uL (ref 150–400)
RBC: 4.55 MIL/uL (ref 3.87–5.11)
RDW: 13 % (ref 11.5–15.5)
WBC: 14.4 10*3/uL — AB (ref 4.0–10.5)
nRBC: 0 % (ref 0.0–0.2)

## 2018-07-31 LAB — BASIC METABOLIC PANEL
Anion gap: 13 (ref 5–15)
BUN: 12 mg/dL (ref 6–20)
CHLORIDE: 102 mmol/L (ref 98–111)
CO2: 22 mmol/L (ref 22–32)
CREATININE: 0.63 mg/dL (ref 0.44–1.00)
Calcium: 8.7 mg/dL — ABNORMAL LOW (ref 8.9–10.3)
GFR calc Af Amer: >60 mL/min (ref >60)
GFR calc non Af Amer: >60 mL/min (ref >60)
GLUCOSE: 125 mg/dL — AB (ref 70–99)
Potassium: 3.8 mmol/L (ref 3.5–5.1)
SODIUM: 137 mmol/L (ref 135–145)

## 2018-07-31 LAB — MRSA PCR SCREENING: MRSA by PCR: NEGATIVE

## 2018-07-31 SURGERY — OPEN REDUCTION INTERNAL FIXATION (ORIF) ANKLE FRACTURE
Anesthesia: General | Site: Ankle | Laterality: Right

## 2018-07-31 MED ORDER — DOCUSATE SODIUM 100 MG PO CAPS
100.0000 mg | ORAL_CAPSULE | Freq: Two times a day (BID) | ORAL | Status: DC
  Administered 2018-07-31: 100 mg via ORAL
  Filled 2018-07-31 (×2): qty 1

## 2018-07-31 MED ORDER — KETOROLAC TROMETHAMINE 15 MG/ML IJ SOLN
15.0000 mg | Freq: Four times a day (QID) | INTRAMUSCULAR | Status: DC
  Administered 2018-07-31 – 2018-08-01 (×2): 15 mg via INTRAVENOUS
  Filled 2018-07-31 (×2): qty 1

## 2018-07-31 MED ORDER — HYDROMORPHONE HCL 1 MG/ML IJ SOLN: 0.5000 mg | INTRAMUSCULAR | Status: DC | PRN

## 2018-07-31 MED ORDER — HYDROMORPHONE HCL 1 MG/ML IJ SOLN
1.0000 mg | Freq: Once | INTRAMUSCULAR | Status: AC
  Administered 2018-07-31: 1 mg via INTRAVENOUS

## 2018-07-31 MED ORDER — DOCUSATE SODIUM 100 MG PO CAPS: 100.0000 mg | ORAL_CAPSULE | Freq: Two times a day (BID) | ORAL | Status: DC

## 2018-07-31 MED ORDER — FENTANYL CITRATE (PF) 100 MCG/2ML IJ SOLN
INTRAMUSCULAR | Status: AC
  Filled 2018-07-31: qty 2

## 2018-07-31 MED ORDER — OXYCODONE HCL 5 MG/5ML PO SOLN: 5.0000 mg | Freq: Once | ORAL | Status: DC | PRN

## 2018-07-31 MED ORDER — HYDROMORPHONE HCL 1 MG/ML IJ SOLN
0.5000 mg | INTRAMUSCULAR | Status: DC | PRN
  Administered 2018-07-31: 0.5 mg via INTRAVENOUS
  Filled 2018-07-31: qty 0.5

## 2018-07-31 MED ORDER — ACETAMINOPHEN 325 MG PO TABS: 325.0000 mg | ORAL_TABLET | Freq: Four times a day (QID) | ORAL | Status: DC | PRN

## 2018-07-31 MED ORDER — ONDANSETRON 8 MG PO TBDP
8.0000 mg | ORAL_TABLET | Freq: Once | ORAL | Status: AC
  Administered 2018-07-31: 8 mg via ORAL
  Filled 2018-07-31: qty 1

## 2018-07-31 MED ORDER — BIOTIN 5 MG PO CAPS: ORAL_CAPSULE | Freq: Every day | ORAL | Status: DC

## 2018-07-31 MED ORDER — SODIUM CHLORIDE 0.9 % IV SOLN
INTRAVENOUS | Status: DC
  Administered 2018-07-31: 22:00:00 via INTRAVENOUS

## 2018-07-31 MED ORDER — HYDROMORPHONE HCL 1 MG/ML IJ SOLN
INTRAMUSCULAR | Status: AC
  Filled 2018-07-31: qty 1

## 2018-07-31 MED ORDER — ONDANSETRON HCL 4 MG/2ML IJ SOLN
4.0000 mg | Freq: Four times a day (QID) | INTRAMUSCULAR | Status: DC | PRN
  Administered 2018-07-31: 4 mg via INTRAVENOUS
  Filled 2018-07-31: qty 2

## 2018-07-31 MED ORDER — NEOMYCIN-POLYMYXIN B GU 40-200000 IR SOLN
Status: AC
  Filled 2018-07-31: qty 20

## 2018-07-31 MED ORDER — OXYCODONE HCL 5 MG PO TABS
5.0000 mg | ORAL_TABLET | ORAL | Status: DC | PRN
  Filled 2018-07-31: qty 2

## 2018-07-31 MED ORDER — ADULT MULTIVITAMIN W/MINERALS CH
1.0000 | ORAL_TABLET | Freq: Every day | ORAL | Status: DC
  Filled 2018-07-31: qty 1

## 2018-07-31 MED ORDER — ONDANSETRON HCL 4 MG/2ML IJ SOLN
INTRAMUSCULAR | Status: DC | PRN
  Administered 2018-07-31: 4 mg via INTRAVENOUS

## 2018-07-31 MED ORDER — METOCLOPRAMIDE HCL 10 MG PO TABS: 5.0000 mg | ORAL_TABLET | Freq: Three times a day (TID) | ORAL | Status: DC | PRN

## 2018-07-31 MED ORDER — COLLAGEN 500 MG PO CAPS: ORAL_CAPSULE | Freq: Every day | ORAL | Status: DC

## 2018-07-31 MED ORDER — KETOROLAC TROMETHAMINE 30 MG/ML IJ SOLN
30.0000 mg | Freq: Once | INTRAMUSCULAR | Status: AC
  Administered 2018-07-31: 30 mg via INTRAVENOUS

## 2018-07-31 MED ORDER — HYDROMORPHONE HCL 1 MG/ML IJ SOLN
0.5000 mg | Freq: Once | INTRAMUSCULAR | Status: AC
  Administered 2018-07-31: 0.5 mg via INTRAVENOUS
  Filled 2018-07-31: qty 1

## 2018-07-31 MED ORDER — CLINDAMYCIN PHOSPHATE 600 MG/50ML IV SOLN
600.0000 mg | Freq: Once | INTRAVENOUS | Status: DC
  Filled 2018-07-31: qty 50

## 2018-07-31 MED ORDER — MAGNESIUM HYDROXIDE 400 MG/5ML PO SUSP: 30.0000 mL | Freq: Every day | ORAL | Status: DC | PRN

## 2018-07-31 MED ORDER — BUPIVACAINE HCL 0.5 % IJ SOLN
INTRAMUSCULAR | Status: DC | PRN
  Administered 2018-07-31: 20 mL

## 2018-07-31 MED ORDER — ONDANSETRON HCL 4 MG PO TABS: 4.0000 mg | ORAL_TABLET | Freq: Four times a day (QID) | ORAL | Status: DC | PRN

## 2018-07-31 MED ORDER — MEPERIDINE HCL 50 MG/ML IJ SOLN: 6.2500 mg | INTRAMUSCULAR | Status: DC | PRN

## 2018-07-31 MED ORDER — BUPIVACAINE HCL (PF) 0.5 % IJ SOLN
INTRAMUSCULAR | Status: AC
  Filled 2018-07-31: qty 30

## 2018-07-31 MED ORDER — METOCLOPRAMIDE HCL 5 MG/ML IJ SOLN: 5.0000 mg | Freq: Three times a day (TID) | INTRAMUSCULAR | Status: DC | PRN

## 2018-07-31 MED ORDER — LORATADINE 10 MG PO TABS: 10.0000 mg | ORAL_TABLET | Freq: Every day | ORAL | Status: DC | PRN

## 2018-07-31 MED ORDER — KETOROLAC TROMETHAMINE 30 MG/ML IJ SOLN
INTRAMUSCULAR | Status: AC
  Administered 2018-07-31: 30 mg via INTRAVENOUS
  Filled 2018-07-31: qty 1

## 2018-07-31 MED ORDER — DEXAMETHASONE SODIUM PHOSPHATE 10 MG/ML IJ SOLN
INTRAMUSCULAR | Status: AC
  Filled 2018-07-31: qty 1

## 2018-07-31 MED ORDER — SUPER GREENS PO POWD: Freq: Every day | ORAL | Status: DC

## 2018-07-31 MED ORDER — ACETAMINOPHEN 325 MG PO TABS: 650.0000 mg | ORAL_TABLET | Freq: Four times a day (QID) | ORAL | Status: DC | PRN

## 2018-07-31 MED ORDER — ENOXAPARIN SODIUM 40 MG/0.4ML ~~LOC~~ SOLN
40.0000 mg | SUBCUTANEOUS | Status: DC
  Administered 2018-08-01: 40 mg via SUBCUTANEOUS
  Filled 2018-07-31: qty 0.4

## 2018-07-31 MED ORDER — TRAZODONE HCL 50 MG PO TABS
50.0000 mg | ORAL_TABLET | Freq: Every evening | ORAL | Status: DC | PRN
  Administered 2018-07-31: 50 mg via ORAL
  Filled 2018-07-31 (×2): qty 1

## 2018-07-31 MED ORDER — ACETAMINOPHEN 10 MG/ML IV SOLN
INTRAVENOUS | Status: AC
  Filled 2018-07-31: qty 100

## 2018-07-31 MED ORDER — ONDANSETRON HCL 4 MG/2ML IJ SOLN: 4.0000 mg | Freq: Four times a day (QID) | INTRAMUSCULAR | Status: DC | PRN

## 2018-07-31 MED ORDER — ONDANSETRON HCL 4 MG/2ML IJ SOLN: INTRAMUSCULAR | Status: DC | PRN

## 2018-07-31 MED ORDER — ONDANSETRON HCL 4 MG/2ML IJ SOLN
INTRAMUSCULAR | Status: AC
  Filled 2018-07-31: qty 2

## 2018-07-31 MED ORDER — DIPHENHYDRAMINE HCL 12.5 MG/5ML PO ELIX: 12.5000 mg | ORAL_SOLUTION | ORAL | Status: DC | PRN

## 2018-07-31 MED ORDER — FENTANYL CITRATE (PF) 100 MCG/2ML IJ SOLN
INTRAMUSCULAR | Status: DC | PRN
  Administered 2018-07-31: 25 ug via INTRAVENOUS
  Administered 2018-07-31 (×2): 50 ug via INTRAVENOUS
  Administered 2018-07-31: 25 ug via INTRAVENOUS

## 2018-07-31 MED ORDER — BISACODYL 10 MG RE SUPP: 10.0000 mg | Freq: Every day | RECTAL | Status: DC | PRN

## 2018-07-31 MED ORDER — OXYCODONE HCL 5 MG PO TABS: 5.0000 mg | ORAL_TABLET | Freq: Once | ORAL | Status: DC | PRN

## 2018-07-31 MED ORDER — MAGNESIUM HYDROXIDE 400 MG/5ML PO SUSP
30.0000 mL | Freq: Every day | ORAL | Status: DC | PRN
  Filled 2018-07-31: qty 30

## 2018-07-31 MED ORDER — CALCIUM-VITAMIN D-VITAMIN K 500-100-40 MG-UNT-MCG PO CHEW: 1.0000 | CHEWABLE_TABLET | Freq: Every day | ORAL | Status: DC

## 2018-07-31 MED ORDER — FLEET ENEMA 7-19 GM/118ML RE ENEM: 1.0000 | ENEMA | Freq: Once | RECTAL | Status: DC | PRN

## 2018-07-31 MED ORDER — DEXAMETHASONE SODIUM PHOSPHATE 10 MG/ML IJ SOLN
INTRAMUSCULAR | Status: DC | PRN
  Administered 2018-07-31: 10 mg via INTRAVENOUS

## 2018-07-31 MED ORDER — ACETAMINOPHEN 500 MG PO TABS
1000.0000 mg | ORAL_TABLET | Freq: Four times a day (QID) | ORAL | Status: DC
  Administered 2018-07-31 – 2018-08-01 (×2): 1000 mg via ORAL
  Filled 2018-07-31 (×2): qty 2

## 2018-07-31 MED ORDER — CLINDAMYCIN PHOSPHATE 600 MG/50ML IV SOLN
600.0000 mg | Freq: Four times a day (QID) | INTRAVENOUS | Status: DC
  Administered 2018-07-31 – 2018-08-01 (×2): 600 mg via INTRAVENOUS
  Filled 2018-07-31 (×3): qty 50

## 2018-07-31 MED ORDER — ACETAMINOPHEN 650 MG RE SUPP: 650.0000 mg | Freq: Four times a day (QID) | RECTAL | Status: DC | PRN

## 2018-07-31 MED ORDER — FENTANYL CITRATE (PF) 100 MCG/2ML IJ SOLN
25.0000 ug | INTRAMUSCULAR | Status: DC | PRN
  Administered 2018-07-31 (×2): 25 ug via INTRAVENOUS
  Administered 2018-07-31: 50 ug via INTRAVENOUS

## 2018-07-31 MED ORDER — TRAMADOL HCL 50 MG PO TABS
50.0000 mg | ORAL_TABLET | Freq: Four times a day (QID) | ORAL | Status: DC | PRN
  Administered 2018-08-01: 50 mg via ORAL
  Filled 2018-07-31: qty 1

## 2018-07-31 MED ORDER — HYDROMORPHONE HCL 1 MG/ML IJ SOLN
1.0000 mg | Freq: Once | INTRAMUSCULAR | Status: DC
  Filled 2018-07-31: qty 1

## 2018-07-31 MED ORDER — ACETAMINOPHEN 10 MG/ML IV SOLN
INTRAVENOUS | Status: DC | PRN
  Administered 2018-07-31: 1000 mg via INTRAVENOUS

## 2018-07-31 MED ORDER — OXYCODONE HCL 5 MG PO TABS
5.0000 mg | ORAL_TABLET | ORAL | Status: DC | PRN
  Administered 2018-07-31: 5 mg via ORAL
  Filled 2018-07-31: qty 1

## 2018-07-31 MED ORDER — LACTATED RINGERS IV SOLN
INTRAVENOUS | Status: DC
  Administered 2018-07-31: 15:00:00 via INTRAVENOUS

## 2018-07-31 MED ORDER — CLINDAMYCIN PHOSPHATE 600 MG/50ML IV SOLN
INTRAVENOUS | Status: DC | PRN
  Administered 2018-07-31: 600 mg via INTRAVENOUS

## 2018-07-31 MED ORDER — PROPOFOL 10 MG/ML IV BOLUS
INTRAVENOUS | Status: DC | PRN
  Administered 2018-07-31: 180 mg via INTRAVENOUS

## 2018-07-31 MED ORDER — MIDAZOLAM HCL 2 MG/2ML IJ SOLN
INTRAMUSCULAR | Status: AC
  Filled 2018-07-31: qty 2

## 2018-07-31 MED ORDER — PHENYLEPHRINE HCL 10 MG/ML IJ SOLN
INTRAMUSCULAR | Status: DC | PRN
  Administered 2018-07-31 (×2): 100 ug via INTRAVENOUS

## 2018-07-31 MED ORDER — HYDROMORPHONE HCL 1 MG/ML IJ SOLN
0.5000 mg | Freq: Once | INTRAMUSCULAR | Status: AC
  Administered 2018-07-31: 0.5 mg via INTRAVENOUS

## 2018-07-31 MED ORDER — SEVOFLURANE IN SOLN
RESPIRATORY_TRACT | Status: AC
  Filled 2018-07-31: qty 250

## 2018-07-31 MED ORDER — PANTOPRAZOLE SODIUM 40 MG IV SOLR: 40.0000 mg | Freq: Every day | INTRAVENOUS | Status: DC

## 2018-07-31 MED ORDER — SODIUM CHLORIDE 0.9 % IV SOLN
INTRAVENOUS | Status: DC
  Administered 2018-07-31: 10:00:00 via INTRAVENOUS

## 2018-07-31 MED ORDER — PROPOFOL 10 MG/ML IV BOLUS
INTRAVENOUS | Status: AC
  Filled 2018-07-31: qty 20

## 2018-07-31 MED ORDER — MIDAZOLAM HCL 2 MG/2ML IJ SOLN
INTRAMUSCULAR | Status: DC | PRN
  Administered 2018-07-31: 2 mg via INTRAVENOUS

## 2018-07-31 MED ORDER — PROMETHAZINE HCL 25 MG/ML IJ SOLN: 6.2500 mg | INTRAMUSCULAR | Status: DC | PRN

## 2018-07-31 SURGICAL SUPPLY — 60 items
BANDAGE ACE 4X5 VEL STRL LF (GAUZE/BANDAGES/DRESSINGS) ×3 IMPLANT
BANDAGE ACE 6X5 VEL STRL LF (GAUZE/BANDAGES/DRESSINGS) ×3 IMPLANT
BIT DRILL 2.5X2.75 QC CALB (BIT) ×3 IMPLANT
BIT DRILL 2.9 CANN QC NONSTRL (BIT) ×3 IMPLANT
BIT DRILL 3.5X5.5 QC CALB (BIT) ×3 IMPLANT
BIT DRILL CALIBRATED 2.7 (BIT) ×2 IMPLANT
BIT DRILL CALIBRATED 2.7MM (BIT) ×1
BLADE SURG SZ10 CARB STEEL (BLADE) ×6 IMPLANT
BNDG COHESIVE 4X5 TAN STRL (GAUZE/BANDAGES/DRESSINGS) ×3 IMPLANT
BNDG ESMARK 6X12 TAN STRL LF (GAUZE/BANDAGES/DRESSINGS) ×3 IMPLANT
BNDG PLASTER FAST 4X5 WHT LF (CAST SUPPLIES) IMPLANT
CANISTER SUCT 1200ML W/VALVE (MISCELLANEOUS) ×3 IMPLANT
CHLORAPREP W/TINT 26ML (MISCELLANEOUS) ×3 IMPLANT
COVER WAND RF STERILE (DRAPES) ×3 IMPLANT
CUFF TOURN 18 STER (MISCELLANEOUS) ×3 IMPLANT
CUFF TOURN 24 STER (MISCELLANEOUS) IMPLANT
CUFF TOURN 30 STER DUAL PORT (MISCELLANEOUS) IMPLANT
DRAPE C-ARM XRAY 36X54 (DRAPES) ×3 IMPLANT
DRAPE C-ARMOR (DRAPES) ×3 IMPLANT
DRAPE INCISE IOBAN 66X45 STRL (DRAPES) ×3 IMPLANT
DRAPE U-SHAPE 47X51 STRL (DRAPES) IMPLANT
ELECT CAUTERY BLADE 6.4 (BLADE) ×3 IMPLANT
ELECT REM PT RETURN 9FT ADLT (ELECTROSURGICAL) ×3
ELECTRODE REM PT RTRN 9FT ADLT (ELECTROSURGICAL) ×1 IMPLANT
GAUZE PETRO XEROFOAM 1X8 (MISCELLANEOUS) ×3 IMPLANT
GAUZE SPONGE 4X4 12PLY STRL (GAUZE/BANDAGES/DRESSINGS) ×3 IMPLANT
GLOVE BIO SURGEON STRL SZ8 (GLOVE) ×6 IMPLANT
GLOVE INDICATOR 8.0 STRL GRN (GLOVE) ×3 IMPLANT
GOWN STRL REUS W/ TWL LRG LVL3 (GOWN DISPOSABLE) ×1 IMPLANT
GOWN STRL REUS W/ TWL XL LVL3 (GOWN DISPOSABLE) ×1 IMPLANT
GOWN STRL REUS W/TWL LRG LVL3 (GOWN DISPOSABLE) ×2
GOWN STRL REUS W/TWL XL LVL3 (GOWN DISPOSABLE) ×2
HEMOVAC 400ML (MISCELLANEOUS)
K-WIRE ACE 1.6X6 (WIRE) ×6
KIT DRAIN HEMOVAC JP 7FR 400ML (MISCELLANEOUS) IMPLANT
KIT TURNOVER KIT A (KITS) ×3 IMPLANT
KWIRE ACE 1.6X6 (WIRE) ×2 IMPLANT
LABEL OR SOLS (LABEL) ×3 IMPLANT
NS IRRIG 1000ML POUR BTL (IV SOLUTION) ×3 IMPLANT
PACK EXTREMITY ARMC (MISCELLANEOUS) ×3 IMPLANT
PAD ABD DERMACEA PRESS 5X9 (GAUZE/BANDAGES/DRESSINGS) ×6 IMPLANT
PAD CAST CTTN 4X4 STRL (SOFTGOODS) ×2 IMPLANT
PAD PREP 24X41 OB/GYN DISP (PERSONAL CARE ITEMS) ×3 IMPLANT
PADDING CAST COTTON 4X4 STRL (SOFTGOODS) ×4
PLATE LOCK 7H 92 BILAT FIB (Plate) ×3 IMPLANT
SCREW ACE CAN 4.0 40M (Screw) ×6 IMPLANT
SCREW CORTICAL 3.5MM 24MM (Screw) ×3 IMPLANT
SCREW LOCK 3.5X14 DIST TIB (Screw) ×3 IMPLANT
SCREW LOCK CORT STAR 3.5X12 (Screw) ×6 IMPLANT
SCREW LOW PROFILE 18MMX3.5MM (Screw) ×3 IMPLANT
SCREW NON LOCKING LP 3.5 14MM (Screw) ×6 IMPLANT
SCREW NON LOCKING LP 3.5 16MM (Screw) ×6 IMPLANT
SPLINT CAST 1 STEP 4X30 (MISCELLANEOUS) ×6 IMPLANT
SPONGE LAP 18X18 RF (DISPOSABLE) ×3 IMPLANT
STAPLER SKIN PROX 35W (STAPLE) ×3 IMPLANT
STOCKINETTE IMPERV 14X48 (MISCELLANEOUS) ×3 IMPLANT
SUT VIC AB 0 CT1 36 (SUTURE) ×3 IMPLANT
SUT VIC AB 2-0 SH 27 (SUTURE) ×6
SUT VIC AB 2-0 SH 27XBRD (SUTURE) ×3 IMPLANT
SYR 10ML LL (SYRINGE) ×3 IMPLANT

## 2018-07-31 NOTE — H&P (Signed)
Subjective:  Chief complaint: Right ankle injury.  The patient is a 62 y.o. female who sustained an injury to the right ankle earlier this morning. Apparently she was sitting in a high chair. As she went to get down off the chair, her foot slipped on the rung of the chair and she caught her leg inside the wrong and twisted her ankle. She was brought to the emergency room where x-rays demonstrated a fracture dislocation of the right ankle. An attempt was made to reduce the ankle and to apply a posterior splint by the ER provider. The patient denies any other injury associated with the injury to her right ankle.  She did not strike her head or lose consciousness.  In addition, she denies any light-headedness, dizziness, chest pain, shortness of breath, or other symptoms which might have contributed to the injury.  Patient Active Problem List   Diagnosis Date Noted  . Ankle fracture, right 07/31/2018  . Adenomatous polyp of sigmoid colon 07/20/2018  . Encounter for screening colonoscopy   . Lyme disease 03/27/2017  . Tuberculosis 03/21/2017  . Cyst of ovary 03/21/2017  . Cannot sleep 03/21/2017   History reviewed. No pertinent past medical history.  Past Surgical History:  Procedure Laterality Date  . ABDOMINAL HYSTERECTOMY    . COLONOSCOPY WITH PROPOFOL N/A 04/26/2018   Procedure: COLONOSCOPY WITH PROPOFOL;  Surgeon: Lin Landsman, MD;  Location: [facility] ENDOSCOPY;  Service: Gastroenterology;  Laterality: N/A;  . TONSILLECTOMY      Medications Prior to Admission  Medication Sig Dispense Refill Last Dose  . BIOTIN PO Take 1 tablet by mouth daily.    07/30/2018 at 0800  . Calcium-Vitamin D-Vitamin K 500-100-40 MG-UNT-MCG CHEW Chew 1 tablet by mouth daily.    07/30/2018 at 0800  . COLLAGEN PO Take 1 tablet by mouth daily.    07/30/2018 at 0800  . loratadine (CLARITIN) 10 MG tablet Take 10 mg by mouth daily as needed.    Past Month at Unknown time  . Misc Natural Products (SUPER GREENS) POWD  Take by mouth daily.   Past Week at Unknown time  . Multiple Vitamins-Minerals (MULTIVITAMIN GUMMIES ADULT) CHEW Chew 1 tablet by mouth daily.    07/30/2018 at 0800  . traZODone (DESYREL) 50 MG tablet TAKE 1/2 TO 1 TABLET (25-50 MG TOTAL) BY MOUTH AT BEDTIME AS NEEDED FOR SLEEP. 30 tablet 3 prn at prn   Allergies  Allergen Reactions  . Chantix  [Varenicline] Itching  . Penicillins     Social History   Tobacco Use  . Smoking status: Current Every Day Smoker    Types: Cigarettes  . Smokeless tobacco: Never Used  Substance Use Topics  . Alcohol use: No    Family History  Problem Relation Age of Onset  . Stomach cancer Mother   . Prostate cancer Father   . COPD Father   . Heart attack Sister   . Breast cancer Maternal Aunt      Review of Systems: As noted above. The patient denies any chest pain, shortness of breath, nausea, vomiting, diarrhea, constipation, belly pain, blood in his/her stool, or burning with urination.  Objective: Temp:  [97.9 F (36.6 C)-98.5 F (36.9 C)] 98 F (36.7 C) (12/03 1111) Pulse Rate:  [92-100] 92 (12/03 1111) Resp:  [19-20] 19 (12/03 1111) BP: (113-122)/(65-72) 113/72 (12/03 1111) SpO2:  [96 %-98 %] 97 % (12/03 1111) Weight:  [61.2 kg] 61.2 kg (12/03 9390)  Physical Exam: General:  Alert, no acute distress  Psychiatric:  Patient is competent for consent with normal mood and affect Cardiovascular:  RRR  Respiratory:  Clear to auscultation. No wheezing. Non-labored breathing GI:  Abdomen is soft and non-tender Skin:  No lesions in the area of chief complaint Neurologic:  Sensation intact distally Lymphatic:  No axillary or cervical lymphadenopathy  Orthopedic Exam:  Orthopedic examination is limited to the right foot and lower leg.  The patient is in a posterior splint with a sugar tong supplement.  The splint appears to be dry and intact.  The skin is intact at the proximal distal margins of the splint.  She is able to dorsiflex and  plantarflex her toes.  Sensation is intact light touch to the tips of all toes.  She has good capillary refill to all toes.  Imaging Review: Recent x-rays of the right ankle are available for review.  These films demonstrate a trimalleolar fracture dislocation of the right ankle with inadequate reduction of the mortise on postreduction views.  No significant degenerative changes are identified.  No lytic lesions are noted.  Assessment: Post displaced trimalleolar fracture dislocation, right ankle.  Plan: The treatment options, including both surgical and nonsurgical choices, have been discussed in detail with the patient. The patient would like to proceed with surgical intervention to include an open reduction and internal fixation of the displaced right ankle fracture. The risks (including bleeding, infection, nerve and/or blood vessel injury, persistent or recurrent pain, malunion and/or nonunion, stiffness of the ankle, development of degenerative joint disease, need for further surgery, blood clots, strokes, heart attacks or arrhythmias, pneumonia, etc.) and benefits of the surgical procedure were discussed. The patient states her understanding and agrees to proceed. She agrees to a blood transfusion if necessary. A formal written consent will be obtained by the nursing staff.

## 2018-07-31 NOTE — ED Triage Notes (Signed)
Pt to triage via w/c with no distress noted; pt reports getting caught up in chair this morning injuring right ankle

## 2018-07-31 NOTE — Anesthesia Post-op Follow-up Note (Signed)
Anesthesia QCDR form completed.        

## 2018-07-31 NOTE — ED Provider Notes (Addendum)
[facility] Emergency Department Provider Note   ____________________________________________   First MD Initiated Contact with Patient 07/31/18 [phone]     (approximate)  I have reviewed the triage vital signs and the nursing notes.   HISTORY  Chief Complaint Ankle Pain    HPI Terri Weaver is a 62 y.o. adult patient presents with right ankle pain and deformity which occurred this morning.  Patient was sit in a highchair and a foot got caught as she tried to exit the chair.  Patient rates the pain as a 10/10.  Patient described the pain as "sharp/achy".  No palliative measures prior to arrival.  History reviewed. No pertinent past medical history.  Patient Active Problem List   Diagnosis Date Noted  . Adenomatous polyp of sigmoid colon 07/20/2018  . Encounter for screening colonoscopy   . Lyme disease 03/27/2017  . Tuberculosis 03/21/2017  . Cyst of ovary 03/21/2017  . Cannot sleep 03/21/2017    Past Surgical History:  Procedure Laterality Date  . ABDOMINAL HYSTERECTOMY    . COLONOSCOPY WITH PROPOFOL N/A 04/26/2018   Procedure: COLONOSCOPY WITH PROPOFOL;  Surgeon: Lin Landsman, MD;  Location: [facility] ENDOSCOPY;  Service: Gastroenterology;  Laterality: N/A;  . TONSILLECTOMY      Prior to Admission medications   Medication Sig Start Date End Date Taking? Authorizing Provider  BIOTIN PO Take by mouth.    [provider]  Calcium-Vitamin D-Vitamin K 676-195-09 MG-UNT-MCG CHEW Chew by mouth.    [provider]  COLLAGEN PO Take by mouth.    [provider]  loratadine (CLARITIN) 10 MG tablet Take by mouth.    [provider]  Multiple Vitamins-Minerals (MULTIVITAMIN GUMMIES ADULT) CHEW Chew by mouth.    [provider]  traZODone (DESYREL) 50 MG tablet TAKE 1/2 TO 1 TABLET (25-50 MG TOTAL) BY MOUTH AT BEDTIME AS NEEDED FOR SLEEP. 06/20/18   Trinna Post, PA-C    Allergies Chantix   [varenicline] and Penicillins  Family History  Problem Relation Age of Onset  . Stomach cancer Mother   . Prostate cancer Father   . COPD Father   . Heart attack Sister   . Breast cancer Maternal Aunt     Social History Social History   Tobacco Use  . Smoking status: Current Every Day Smoker    Types: Cigarettes  . Smokeless tobacco: Never Used  Substance Use Topics  . Alcohol use: No  . Drug use: No    Review of Systems Constitutional: No fever/chills Eyes: No visual changes. ENT: No sore throat. Cardiovascular: Denies chest pain. Respiratory: Denies shortness of breath. Gastrointestinal: No abdominal pain.  No nausea, no vomiting.  No diarrhea.  No constipation. Genitourinary: Negative for dysuria. Musculoskeletal: Right ankle pain and edema. Skin: Negative for rash. Neurological: Negative for headaches, focal weakness or numbness. Allergic/Immunilogical: Chantix and penicillin. ____________________________________________   PHYSICAL EXAM:  VITAL SIGNS: ED Triage Vitals  Enc Vitals Group     BP 07/31/18 0631 116/65     Pulse Rate 07/31/18 0631 100     Resp 07/31/18 0631 20     Temp 07/31/18 0631 97.9 F (36.6 C)     Temp Source 07/31/18 0631 Oral     SpO2 07/31/18 0631 96 %     Weight 07/31/18 0627 135 lb (61.2 kg)     Height 07/31/18 0627 5\' 3"  (1.6 m)     Head Circumference --      Peak Flow --  Pain Score 07/31/18 0627 8     Pain Loc --      Pain Edu? --      Excl. in [place]? --    Constitutional: Alert and oriented. Well appearing and in moderate distress. Cardiovascular: Normal rate, regular rhythm. Grossly normal heart sounds.  Good peripheral circulation. Respiratory: Normal respiratory effort.  No retractions. Lungs CTAB. Gastrointestinal: Soft and nontender. No distention. No abdominal bruits. No CVA tenderness. Musculoskeletal: Obvious deformity and edema to the right ankle.  Neurologic:  Normal speech and language. No gross focal neurologic  deficits are appreciated. No gait instability. Skin:  Skin is warm, dry and intact. No rash noted. Psychiatric: Mood and affect are normal. Speech and behavior are normal.  ____________________________________________   LABS (all labs ordered are listed, but only abnormal results are displayed)  Labs Reviewed  CBC WITH DIFFERENTIAL/PLATELET - Abnormal; Notable for the following components:      Result Value   WBC 14.4 (*)    Neutro Abs 12.6 (*)    Abs Immature Granulocytes 0.09 (*)    All other components within normal limits  BASIC METABOLIC PANEL   ____________________________________________  EKG   ____________________________________________  RADIOLOGY  ED MD interpretation:    Official radiology report(s): Dg Ankle Complete Right  Result Date: 07/31/2018 CLINICAL DATA:  62 year old female with trauma to the right ankle. EXAM: RIGHT ANKLE - COMPLETE 3+ VIEW COMPARISON:  None. FINDINGS: There is a mildly displaced oblique fracture of the distal fibula with lateral displacement of the distal fracture fragment. There is a displaced fracture of the medial malleolus with approximately 11 mm lateral displacement. There is a minimally displaced fracture of the posterior malleolus. There is approximately 15 mm lateral subluxation of the ankle mortise. There is diffuse soft tissue swelling of the ankle. IMPRESSION: Trimalleolar fracture with lateral subluxation of the ankle mortise. Electronically Signed   By: Anner Crete M.D.   On: 07/31/2018 06:58    ____________________________________________   PROCEDURES  Procedure(s) performed:   .Splint Application Date/Time: 99/09/4266 7:59 AM Performed by: Brayton Caves, NT Authorized by: Sable Feil, PA-C   Consent:    Consent obtained:  Verbal   Consent given by:  Patient   Risks discussed:  Numbness, pain and swelling Pre-procedure details:    Sensation:  Normal Procedure details:    Laterality:  Right    Location:  Ankle   Cast type:  Short leg   Supplies:  Ortho-Glass and elastic bandage Post-procedure details:    Pain:  Unchanged   Sensation:  Normal   Patient tolerance of procedure:  Tolerated well, no immediate complications    Critical Care performed:   ____________________________________________   INITIAL IMPRESSION / ASSESSMENT AND PLAN / ED COURSE  As part of my medical decision making, I reviewed the following data within the [place]    Patient presents with pain, deformity, and swelling to the right ankle.  Discussed x-ray findings with patient consisting of a trimalleolus fracture with lateral subluxation of the ankle mortise.  Subluxation reduced by Dr. Corky Downs and patient placed in a posterior/ stirrup OCL ankle splint.  Patient will be admitted under orthopedics for surgery.     ____________________________________________   FINAL CLINICAL IMPRESSION(S) / ED DIAGNOSES  Final diagnoses:  Trimalleolar fracture of ankle, closed, right, initial encounter     ED Discharge Orders    None       Note:  This document was prepared using Dragon voice recognition software  and may include unintentional dictation errors.    Sable Feil, PA-C 07/31/18 0756    Sable Feil, PA-C 07/31/18 0800    Lavonia Drafts, MD 07/31/18 [phone]

## 2018-07-31 NOTE — Op Note (Addendum)
07/31/2018  5:37 PM  Patient:   Terri Weaver  Pre-Op Diagnosis:   Closed trimalleolar fracture dislocation, right ankle.  Post-Op Diagnosis:   Same.  Procedure:   Open reduction and internal fixation of bimalleolar fracture dislocation, right ankle.  Surgeon:   Pascal Lux, MD  Assistant:   None  Anesthesia:   General LMA  Findings:   As above.  Complications:   None  EBL:   10 cc  Fluids:   800 cc crystalloid  UOP:   None  TT:   76 min at 250 mmHg  Drains:   None  Closure:   Staples  Implants:   Biomet ALPS 7-hole composite locking plate and screws  Brief Clinical Note:   The patient is a 62 year old female who sustained the above-noted injury earlier this morning when she twisted her ankle while trying to get off of a high stool. She presented to the emergency room where x-rays demonstrated the above-noted injury. The patient presents at this time for definitive management of his/her injury.  Procedure:   The patient was brought into the operating room and lain in the supine position. After adequate general laryngeal mask anesthesia was obtained, the right foot and lower leg were prepped with ChloraPrep solution before being draped sterilely. Preoperative antibiotics were administered. A timeout was performed to verify the appropriate surgical site before the limb was exsanguinated with an Esmarch and the calf tourniquet inflated to 250 mmHg. Laterally, an 8-10 cm incision was made over the lateral aspect of the distal fibula. The incision was carried down through the subcutaneous tissues to expose the fracture site. The fracture hematoma was debrided before the fracture was reduced and temporarily secured using a bone clamp. A lag screw was placed in an anterior to posterior direction perpendicular to the fracture. A 7-hole Biomet ALPS composite locking plate was contoured appropriately using the plate benders, then applied over the lateral aspect of the distal  fibula. After verifying its position fluoroscopically, it was secured using a 3.5 mm nonlocking cortical screw proximal to the fracture. Again the plate's position was adjusted slightly based on AP and lateral projections before it was secured using additional bicortical screws proximally and multiple locking screws distally. The adequacy of fracture reduction and hardware position was verified fluoroscopically in AP and lateral projections and found to be excellent.  One nonlocking screw proximally was removed and replaced with a shorter screw as it was too long.  Attention was directed to the medial side. An approximately 3 cm longitudinal incision was made over the anterior and distal portions of the medial malleolus. This incision also was carried down through the subcutaneous tissues to expose the fracture site. Care was taken to identify and protect the saphenous nerve and vein. The fracture hematoma again was removed before the fracture was reduced. Two guidewires were placed obliquely across the fracture from distal to proximal into the distal tibial metaphysis. After verifying their positions fluoroscopically, each guidewire was sequentially over-reamed and replaced with a 40 mm partially threaded 4.0 cancellous screw in lag fashion. Again the adequacy of fracture reduction, hardware position, and mortise restoration was verified in AP, lateral, and oblique projections and found to be excellent.  Each wound was copiously irrigated with sterile saline solution. Laterally, the subcutaneous tissues were closed in two layers using 2-0 Vicryl interrupted sutures before the skin was closed using staples. Medially, the subcutaneous tissues were closed using 2-0 Vicryl interrupted sutures before the skin was closed using staples.  A total of 20 cc of 0.5% plain Sensorcaine was injected in and around the incision sites to help with postoperative analgesia. Sterile bulky dressings were applied to the wounds before  the patient was placed into a posterior splint with a sugar tong supplement, maintaining the ankle in neutral dorsiflexion. The patient was then awakened, extubated, and returned to the recovery room in satisfactory condition after tolerating the procedure well.

## 2018-07-31 NOTE — ED Notes (Signed)
Pt states she got caught on her barstool chair this morning and fell. Pt denies LOC or hitting head. Pt c/o 10/10 pain at this time after having to move to bed. Full sensation in right foot at this time. Bruising and swelling noted to right ankle.

## 2018-07-31 NOTE — ED Notes (Signed)
Pt R leg elevated and ice pack applied over splint.

## 2018-07-31 NOTE — Anesthesia Procedure Notes (Signed)
Procedure Name: LMA Insertion Date/Time: 07/31/2018 3:40 PM Performed by: Jonna Clark, CRNA Pre-anesthesia Checklist: Patient identified, Patient being monitored, Timeout performed, Emergency Drugs available and Suction available Patient Re-evaluated:Patient Re-evaluated prior to induction Oxygen Delivery Method: Circle system utilized Preoxygenation: Pre-oxygenation with 100% oxygen Induction Type: IV induction Ventilation: Mask ventilation without difficulty LMA: LMA inserted LMA Size: 3.0 Tube type: Oral Number of attempts: 1 Placement Confirmation: positive ETCO2 and breath sounds checked- equal and bilateral Tube secured with: Tape Dental Injury: Teeth and Oropharynx as per pre-operative assessment

## 2018-07-31 NOTE — Transfer of Care (Signed)
Immediate Anesthesia Transfer of Care Note  Patient: Terri Weaver  Procedure(s) Performed: Procedure(s): OPEN REDUCTION INTERNAL FIXATION (ORIF) ANKLE FRACTURE (Right)  Patient Location: PACU  Anesthesia Type:General  Level of Consciousness: sedated  Airway & Oxygen Therapy: Patient Spontanous Breathing and Patient connected to face mask oxygen  Post-op Assessment: Report given to RN and Post -op Vital signs reviewed and stable  Post vital signs: Reviewed and stable  Last Vitals:  Vitals:   07/31/18 1730 07/31/18 1742  BP:  111/75  Pulse:  87  Resp:  (!) 21  Temp: 36.7 C 36.7 C  SpO2:  829%    Complications: No apparent anesthesia complications

## 2018-07-31 NOTE — Anesthesia Preprocedure Evaluation (Addendum)
Anesthesia Evaluation  Patient identified by MRN, date of birth, ID band Patient awake    Reviewed: Allergy & Precautions, NPO status , Patient's Chart, lab work & pertinent test results  History of Anesthesia Complications Negative for: history of anesthetic complications  Airway Mallampati: II  TM Distance: >3 FB Neck ROM: Full    Dental  (+) Implants   Pulmonary neg sleep apnea, neg COPD, Current Smoker,    breath sounds clear to auscultation- rhonchi (-) wheezing      Cardiovascular Exercise Tolerance: Good (-) hypertension(-) CAD, (-) Past MI, (-) Cardiac Stents and (-) CABG  Rhythm:Regular Rate:Normal - Systolic murmurs and - Diastolic murmurs    Neuro/Psych negative neurological ROS  negative psych ROS   GI/Hepatic negative GI ROS, Neg liver ROS,   Endo/Other  negative endocrine ROSneg diabetes  Renal/GU negative Renal ROS     Musculoskeletal negative musculoskeletal ROS (+)   Abdominal (+) - obese,   Peds  Hematology negative hematology ROS (+)   Anesthesia Other Findings    Reproductive/Obstetrics                             Anesthesia Physical Anesthesia Plan  ASA: II  Anesthesia Plan: General   Post-op Pain Management:    Induction: Intravenous  PONV Risk Score and Plan: 1 and Ondansetron and Midazolam  Airway Management Planned: LMA  Additional Equipment:   Intra-op Plan:   Post-operative Plan:   Informed Consent: I have reviewed the patients History and Physical, chart, labs and discussed the procedure including the risks, benefits and alternatives for the proposed anesthesia with the patient or authorized representative who has indicated his/her understanding and acceptance.   Dental advisory given  Plan Discussed with: CRNA and Anesthesiologist  Anesthesia Plan Comments:        Anesthesia Quick Evaluation

## 2018-07-31 NOTE — Progress Notes (Signed)
PHARMACIST - PHYSICIAN ORDER COMMUNICATION  CONCERNING: P&T Medication Policy on Herbal Medications  DESCRIPTION:  This patient's order for:  Biotin, Collagen, and Super Greens Powder  have been noted.  This product(s) is classified as an "herbal" or natural product. Due to a lack of definitive safety studies or FDA approval, nonstandard manufacturing practices, plus the potential risk of unknown drug-drug interactions while on inpatient medications, the Pharmacy and Therapeutics Committee does not permit the use of "herbal" or natural products of this type within [facility].   ACTION TAKEN: The pharmacy department is unable to verify this order at this time.  Please reevaluate patient's clinical condition at discharge and address if the herbal or natural product(s) should be resumed at that time.

## 2018-07-31 NOTE — ED Notes (Signed)
Pt states that she has not had anything to eat since last night.

## 2018-07-31 NOTE — ED Provider Notes (Signed)
.  Ortho Injury Treatment Date/Time: 07/31/2018 8:10 AM Performed by: Lavonia Drafts, MD Authorized by: Lavonia Drafts, MD   Consent:    Consent obtained:  Verbal   Consent given by:  Patient   Risks discussed:  Vascular damage, fracture, recurrent dislocation, irreducible dislocation and nerve damage   Alternatives discussed:  No treatmentInjury location: ankle Location details: right ankle Injury type: fracture-dislocation Fracture type: trimalleolar Pre-procedure distal perfusion: normal Pre-procedure neurological function: diminished Pre-procedure range of motion: reduced  Anesthesia: Local anesthesia used: no  Patient sedated: NoManipulation performed: yes Skin traction used: no Skeletal traction used: yes Reduction successful: yes X-ray confirmed reduction: yes Immobilization: splint Splint type: sugar tong and short leg Supplies used: cotton padding,  Ortho-Glass and elastic bandage Post-procedure distal perfusion: normal Post-procedure neurological function: diminished Post-procedure range of motion: improved       Lavonia Drafts, MD 07/31/18 [phone]

## 2018-08-01 ENCOUNTER — Encounter: Payer: Self-pay | Admitting: Surgery

## 2018-08-01 MED ORDER — CALCIUM CARBONATE-VITAMIN D 500-200 MG-UNIT PO TABS
1.0000 | ORAL_TABLET | Freq: Every day | ORAL | Status: DC
  Administered 2018-08-01: 1 via ORAL
  Filled 2018-08-01: qty 1

## 2018-08-01 MED ORDER — ASPIRIN EC 325 MG PO TBEC: 325.0000 mg | DELAYED_RELEASE_TABLET | Freq: Every day | ORAL | 0 refills | Status: DC

## 2018-08-01 MED ORDER — OXYCODONE HCL 5 MG PO TABS: 5.0000 mg | ORAL_TABLET | ORAL | 0 refills | Status: DC | PRN

## 2018-08-01 NOTE — Progress Notes (Signed)
  Subjective: 1 Day Post-Op Procedure(s) (LRB): OPEN REDUCTION INTERNAL FIXATION (ORIF) ANKLE FRACTURE (Right) Patient reports pain as mild.   Patient is well, and has had no acute complaints or problems Plan is to go Home after hospital stay. Negative for chest pain and shortness of breath Fever: no Gastrointestinal:Negative for nausea and vomiting  Objective: Vital signs in last 24 hours: Temp:  [97 F (36.1 C)-99 F (37.2 C)] 97.9 F (36.6 C) (12/04 0733) Pulse Rate:  [77-99] 83 (12/04 0733) Resp:  [10-21] 18 (12/04 0733) BP: (97-127)/(61-78) 113/68 (12/04 0733) SpO2:  [90 %-100 %] 95 % (12/04 0733) Weight:  [61.2 kg] 61.2 kg (12/04 0445)  Intake/Output from previous day:  Intake/Output Summary (Last 24 hours) at 08/01/2018 0751 Last data filed at 07/31/2018 1800 Gross per 24 hour  Intake 1195.13 ml  Output 485 ml  Net 710.13 ml    Intake/Output this shift: No intake/output data recorded.  Labs: Recent Labs    07/31/18 0726  HGB 15.0   Recent Labs    07/31/18 0726  WBC 14.4*  RBC 4.55  HCT 43.1  PLT 269   Recent Labs    07/31/18 0726  NA 137  K 3.8  CL 102  CO2 22  BUN 12  CREATININE 0.63  GLUCOSE 125*  CALCIUM 8.7*   No results for input(s): LABPT, INR in the last 72 hours.   EXAM General - Patient is Alert, Appropriate and Oriented Extremity - ABD soft Incision: dressing C/D/I  ABle to flex and extend toes. Cap refill intact. Intact to light touch to the dorsal and volar aspect of all toes. Dressing/Incision - Right leg splint is clean, dry and intact. Motor Function - intact, moving toes well on exam.   History reviewed. No pertinent past medical history.  Assessment/Plan: 1 Day Post-Op Procedure(s) (LRB): OPEN REDUCTION INTERNAL FIXATION (ORIF) ANKLE FRACTURE (Right) Active Problems:   Ankle fracture, right  Estimated body mass index is 23.91 kg/m as calculated from the following:   Height as of this encounter: 5\' 3"  (1.6 m).  Weight as of this encounter: 61.2 kg. Advance diet Up with therapy D/C IV fluids when tolerating po intake.  Labs reviewed this AM. Up with therapy today. Plan will be for discharge home today following therapy.  DVT Prophylaxis - Lovenox and TED hose Non-weightbearing to the right leg.  Raquel Catarino Vold, PA-C [facility] Orthopaedic [facility] 08/01/2018, 7:51 AM

## 2018-08-01 NOTE — Care Management (Signed)
[facility] accepted [facility]. Patient has obtained a walker from her work place.  No other RNCM needs. Surgeon updated; PA has not returned page X2.

## 2018-08-01 NOTE — Care Management (Signed)
I have paged Mia Creek PA for OPPT assignment.

## 2018-08-01 NOTE — Evaluation (Signed)
Physical Therapy Evaluation Patient Details Name: Terri Weaver MRN: [id number] DOB: Jan 08, 1956 Today's Date: 08/01/2018   History of Present Illness  The patient is a 62 y.o. female who sustained an injury to the right ankle.  Apparently she was sitting in a high chair. As she went to get down off the chair, her foot slipped on the rung of the chair and she caught her leg inside the rung and twisted her ankle. She was brought to the emergency room where x-rays demonstrated a fracture dislocation of the right ankle. An attempt was made to reduce the ankle and to apply a posterior splint by the ER provider. The patient denies any other injury associated with the injury to her right ankle.  She did not strike her head or lose consciousness.  In addition, she denies any light-headedness, dizziness, chest pain, shortness of breath, or other symptoms which might have contributed to the injury.  Pt diagnosed with closed trimalleolar fracture dislocation of the right ankle and is now s/p open reduction and internal fixation of bimalleolar fracture dislocation.    Clinical Impression  Pt presents with deficits in strength, transfers, gait, and activity tolerance but performed very well during the session overall especially considering her RLE NWB status.  Pt was Ind with bed mobility tasks and CGA with transfers and amb.  Pt was able to perform transfers and amb with a RW with only min-mod verbal cues for sequencing and maintained her RLE NWB status throughout the session.  Stair training and practice provided with pt and spouse ascending and descending 2 steps with a RW backwards with pt demonstrating good control and stability along with WB status compliance.  Pt will benefit from HHPT services upon discharge to safely address above deficits for decreased caregiver assistance and eventual return to PLOF.       Follow Up Recommendations Home health PT;Supervision for mobility/OOB    Equipment  Recommendations  Rolling walker with 5" wheels;Other (comment)(Pt declined BSC and w/c at this time)    Recommendations for Other Services       Precautions / Restrictions Precautions Precautions: Fall Restrictions Weight Bearing Restrictions: Yes RLE Weight Bearing: Non weight bearing      Mobility  Bed Mobility Overal bed mobility: Independent             General bed mobility comments: Good speed and effort with all bed mobility tasks  Transfers Overall transfer level: Needs assistance Equipment used: Rolling walker (2 wheeled) Transfers: Sit to/from [facility] Sit to Stand: Supervision Stand pivot transfers: Supervision       General transfer comment: Min verbal cues for sequencing most notably for proper hand placement with RLE NWB status maintained throughout  Ambulation/Gait Ambulation/Gait assistance: Min guard Gait Distance (Feet): 30 Feet x 2 Assistive device: Rolling walker (2 wheeled) Gait Pattern/deviations: (Hop-to gait pattern) Gait velocity: Decreased   General Gait Details: Mod verbal and visual cues for proper sequencing with hop-to pattern with a RW with RLE NWB status maintained throughout  Stairs Stairs: Yes Stairs assistance: Min guard Stair Management: Backwards;With walker Number of Stairs: 2 General stair comments: Mod verbal and visual cues during stair training with pt and spouse with pt steady ascending and descending steps  Wheelchair Mobility    Modified Rankin (Stroke Patients Only)       Balance Overall balance assessment: No apparent balance deficits (not formally assessed)  Pertinent Vitals/Pain Pain Assessment: 0-10 Pain Score: 3  Pain Location: R ankle Pain Descriptors / Indicators: Aching;Sore Pain Intervention(s): Monitored during session;Premedicated before session    [place] expects to be discharged to:: Private  residence Living Arrangements: Spouse/significant other Available Help at Discharge: Family;Available 24 hours/day Type of Home: House Home Access: Stairs to enter Entrance Stairs-Rails: None Entrance Stairs-Number of Steps: 2 Home Layout: One level Home Equipment: Other (comment) Additional Comments: Has access to a knee scooter    Prior Function Level of Independence: Independent         Comments: Pt works Medical laboratory scientific officer and is Ind with amb without an AD community distances with no other fall history other than current fall per history.  Pt Ind with all ADLs and IADLs.     Hand Dominance        Extremity/Trunk Assessment   Upper Extremity Assessment Upper Extremity Assessment: Overall WFL for tasks assessed    Lower Extremity Assessment Lower Extremity Assessment: RLE deficits/detail RLE: Unable to fully assess due to immobilization RLE Sensation: WNL    Cervical / Trunk Assessment Cervical / Trunk Assessment: Normal  Communication   Communication: No difficulties  Cognition Arousal/Alertness: Awake/alert Behavior During Therapy: WFL for tasks assessed/performed Overall Cognitive Status: Within Functional Limits for tasks assessed                                        General Comments      Exercises Other Exercises Other Exercises: Knee scooter verbal and visual training with pt attempting to practice using the scooter but unable to tolerate secondary to pain   Assessment/Plan    PT Assessment Patient needs continued PT services  PT Problem List Decreased strength;Decreased activity tolerance;Decreased knowledge of use of DME       PT Treatment Interventions DME instruction;Gait training;Stair training;Functional mobility training;Balance training;Therapeutic exercise;Therapeutic activities;Patient/family education    PT Goals (Current goals can be found in the Care Plan section)  Acute Rehab PT Goals Patient Stated Goal: To get back home PT  Goal Formulation: With patient Time For Goal Achievement: 08/14/18 Potential to Achieve Goals: Good    Frequency BID   Barriers to discharge        Co-evaluation               AM-PAC PT "6 Clicks" Mobility  Outcome Measure Help needed turning from your back to your side while in a flat bed without using bedrails?: None Help needed moving from lying on your back to sitting on the side of a flat bed without using bedrails?: None Help needed moving to and from a bed to a chair (including a wheelchair)?: Total Help needed standing up from a chair using your arms (e.g., wheelchair or bedside chair)?: A Little Help needed to walk in hospital room?: A Little Help needed climbing 3-5 steps with a railing? : A Little 6 Click Score: 18    End of Session Equipment Utilized During Treatment: Gait belt Activity Tolerance: Patient tolerated treatment well Patient left: in chair;with call bell/phone within reach;with chair alarm set;with family/visitor present Nurse Communication: Mobility status PT Visit Diagnosis: Difficulty in walking, not elsewhere classified (R26.2);Muscle weakness (generalized) (M62.81)    Time: 5366-4403 PT Time Calculation (min) (ACUTE ONLY): 55 min   Charges:   PT Evaluation $PT Eval Low Complexity: 1 Low PT Treatments $Gait Training: 23-37 mins  Linus Salmons PT, DPT 08/01/18, 12:13 PM

## 2018-08-01 NOTE — Discharge Instructions (Signed)
Diet: As you were doing prior to hospitalization   Shower:  May shower but keep the wounds dry, use an occlusive plastic wrap, NO SOAKING IN TUB.  If the bandage gets wet, change with a clean dry gauze.  Dressing:  Remain in splint until first follow-up appointment.    Activity:  Increase activity slowly as tolerated, but follow the weight bearing instructions below.  No lifting or driving for 6 weeks.  Weight Bearing:   Non-weightbearing to the right leg.  Blood Clot Prevention: Take 1 325mg  aspirin daily   To prevent constipation: you may use a stool softener such as -  Colace (over the counter) 100 mg by mouth twice a day  Drink plenty of fluids (prune juice may be helpful) and high fiber foods Miralax (over the counter) for constipation as needed.    Itching:  If you experience itching with your medications, try taking only a single pain pill, or even half a pain pill at a time.  You may take up to 10 pain pills per day, and you can also use benadryl over the counter for itching or also to help with sleep.   Precautions:  If you experience chest pain or shortness of breath - call 911 immediately for transfer to the hospital emergency department!!  If you develop a fever greater that 101 F, purulent drainage from wound, increased redness or drainage from wound, or calf pain-Call [place]                                              Follow- Up Appointment:  Please call for an appointment to be seen in 2 weeks at [facility]

## 2018-08-01 NOTE — Discharge Summary (Signed)
Physician Discharge Summary  Patient ID: Terri Weaver MRN: [id number] DOB/AGE: Jan 23, 1956 62 y.o.  Admit date: 07/31/2018 Discharge date: 08/01/2018  Admission Diagnoses:  Trimalleolar fracture of ankle, closed, right, initial encounter [S82.851A]  Discharge Diagnoses: Patient Active Problem List   Diagnosis Date Noted  . Ankle fracture, right 07/31/2018  . Adenomatous polyp of sigmoid colon 07/20/2018  . Encounter for screening colonoscopy   . Lyme disease 03/27/2017  . Tuberculosis 03/21/2017  . Cyst of ovary 03/21/2017  . Cannot sleep 03/21/2017   History reviewed. No pertinent past medical history.   Transfusion: None.   Consultants (if any):   Discharged Condition: Improved  Hospital Course: Terri Weaver is an 62 y.o. adult who was admitted 07/31/2018 with a diagnosis of a closed trimalleolar fracture dislocation of the right ankle and went to the operating room on 07/31/2018 and underwent the above named procedures.    Surgeries: Procedure(s): OPEN REDUCTION INTERNAL FIXATION (ORIF) ANKLE FRACTURE on 07/31/2018 Patient tolerated the surgery well. Taken to PACU where she was stabilized and then transferred to the orthopedic floor.  Started on Lovenox 40mg  q 24 hrs. Foot pumps applied bilaterally at 80 mm. Heels elevated on bed with rolled towels. No evidence of DVT. Negative [place]. Physical therapy started on day #1 for gait training and transfer. OT started day #1 for ADL and assisted devices.  Patient's IV was removed on POD1.  Implants: Biomet ALPS 7-hole composite locking plate and screws  She was given perioperative antibiotics:  Anti-infectives (From admission, onward)   Start     Dose/Rate Route Frequency Ordered Stop   07/31/18 2200  clindamycin (CLEOCIN) IVPB 600 mg     600 mg 100 mL/hr over 30 Minutes Intravenous Every 6 hours 07/31/18 2033 08/01/18 1559   07/31/18 1600  clindamycin (CLEOCIN) IVPB 600 mg  Status:  Discontinued     600 mg 100  mL/hr over 30 Minutes Intravenous  Once 07/31/18 0746 07/31/18 1858    .  She was given sequential compression devices, early ambulation, and Lovenox for DVT prophylaxis.  She benefited maximally from the hospital stay and there were no complications.    Recent vital signs:  Vitals:   08/01/18 0445 08/01/18 0733  BP: 99/65 113/68  Pulse: 82 83  Resp: 14 18  Temp: 98.5 F (36.9 C) 97.9 F (36.6 C)  SpO2: 95% 95%    Recent laboratory studies:  Lab Results  Component Value Date   HGB 15.0 07/31/2018   HGB 15.4 04/06/2018   HGB 13.8 03/21/2017   Lab Results  Component Value Date   WBC 14.4 (H) 07/31/2018   PLT 269 07/31/2018   No results found for: INR Lab Results  Component Value Date   NA 137 07/31/2018   K 3.8 07/31/2018   CL 102 07/31/2018   CO2 22 07/31/2018   BUN 12 07/31/2018   CREATININE 0.63 07/31/2018   GLUCOSE 125 (H) 07/31/2018    Discharge Medications:   Allergies as of 08/01/2018      Reactions   Chantix  [varenicline] Itching   Penicillins       Medication List    TAKE these medications   aspirin EC 325 MG tablet Take 1 tablet (325 mg total) by mouth daily.   BIOTIN PO Take 1 tablet by mouth daily.   Calcium-Vitamin D-Vitamin K 500-100-40 MG-UNT-MCG Chew Chew 1 tablet by mouth daily.   CLARITIN 10 MG tablet Generic drug:  loratadine Take 10 mg by mouth daily as  needed.   COLLAGEN PO Take 1 tablet by mouth daily.   MULTIVITAMIN GUMMIES ADULT Chew Chew 1 tablet by mouth daily.   oxyCODONE 5 MG immediate release tablet Commonly known as:  Oxy IR/ROXICODONE Take 1-2 tablets (5-10 mg total) by mouth every 4 (four) hours as needed for moderate pain (pain score 4-6).   SUPER GREENS Powd Take by mouth daily.   traZODone 50 MG tablet Commonly known as:  DESYREL TAKE 1/2 TO 1 TABLET (25-50 MG TOTAL) BY MOUTH AT BEDTIME AS NEEDED FOR SLEEP.       Diagnostic Studies: Dg Ankle 2 Views Right  Result Date: 07/31/2018 CLINICAL DATA:   ORIF of right ankle fracture EXAM: RIGHT ANKLE - 2 VIEW COMPARISON:  Right ankle films of 07/31/2017 FINDINGS: Plate and screw fixation of the distal right fibular fracture has been performed. A single loose screw is noted adjacent to the fixation plate. Screws are present for fixation of the medial malleolar fracture in anatomic alignment. The tibiotalar articulation appears normal. IMPRESSION: 1. Fixation of the medial and lateral malleolar fractures. 2. Tibiotalar alignment appears normal. Electronically Signed   By: Ivar Drape M.D.   On: 07/31/2018 17:08   Dg Ankle Complete Right  Result Date: 07/31/2018 CLINICAL DATA:  Post reduction. EXAM: RIGHT ANKLE - COMPLETE 3+ VIEW COMPARISON:  None. FINDINGS: Trimalleolar fracture with lateral subluxation of the ankle mortise is slightly improved as seen through plaster splint. There is less lateral subluxation, and less anterior subluxation. IMPRESSION: Closed reduction as described. Electronically Signed   By: Staci Righter M.D.   On: 07/31/2018 08:25   Dg Ankle Complete Right  Result Date: 07/31/2018 CLINICAL DATA:  62 year old female with trauma to the right ankle. EXAM: RIGHT ANKLE - COMPLETE 3+ VIEW COMPARISON:  None. FINDINGS: There is a mildly displaced oblique fracture of the distal fibula with lateral displacement of the distal fracture fragment. There is a displaced fracture of the medial malleolus with approximately 11 mm lateral displacement. There is a minimally displaced fracture of the posterior malleolus. There is approximately 15 mm lateral subluxation of the ankle mortise. There is diffuse soft tissue swelling of the ankle. IMPRESSION: Trimalleolar fracture with lateral subluxation of the ankle mortise. Electronically Signed   By: Anner Crete M.D.   On: 07/31/2018 06:58   Dg C-arm 1-60 Min  Result Date: 07/31/2018 CLINICAL DATA:  Fixation of medial malleolar fractures on the right. EXAM: DG C-ARM 61-120 MIN COMPARISON:  Right ankle  films of 07/31/2017 FINDINGS: C-arm fluoroscopy was provided during ORIF of the right ankle fractures. Fluoroscopy time of 37 seconds was recorded. IMPRESSION: C-arm fluoroscopy provided. Electronically Signed   By: Ivar Drape M.D.   On: 07/31/2018 17:08   Disposition: Plan for discharge today pending progress with PT.  Follow-up Information    Lattie Corns, PA-C Follow up in 14 day(s).   Specialty:  Physician Assistant Why:  Electa Sniff information: [place] [address] [phone]          Signed: Judson Roch PA-C 08/01/2018, 7:56 AM

## 2018-08-01 NOTE — Care Management (Signed)
RNCM spoke with patient. 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  [facility] cannot provided home PT to patient. Checking with [facility].  Patient states she has transportation to outpatient PT

## 2018-08-01 NOTE — Care Management Note (Addendum)
Case Management Note  Patient Details  Name: Terri Weaver MRN: [id number] Date of Birth: 04/11/1956  Subjective/Objective:                  RNCM spoke with patient by phone. She is from home with her husband. She plans to return to home and eager to work with PT prior to discharge today. She does not have any DME available for use at home.  Her PCP is with [facility].   Action/Plan: RNCM will provide list of agencies. She has [place] which typically goes through [facility] for needs.  Patient agrees for me to check with [facility] first.  RNCM working to get patient what she needs prior to discharge. Update: [facility] cannot accept patient for PT however they can provide DME. Message sent to Dr. Roland Rack to help with outpatient PT if needed.   Expected Discharge Date:  08/01/18               Expected Discharge Plan:     In-House Referral:     Discharge planning Services  CM Consult  Post Acute Care Choice:  Durable Medical Equipment, Home Health Choice offered to:  Patient  DME Arranged:    DME Agency:     HH Arranged:    [place] Agency:     Status of Service:  In process, will continue to follow  If discussed at Long Length of Stay Meetings, dates discussed:    Additional Comments:  Marshell Garfinkel, RN 08/01/2018, 8:03 AM

## 2018-08-01 NOTE — Progress Notes (Signed)
Clinical Social Worker (CSW) received SNF consult. PT is recommending home health. RN case manager aware of above. Please reconsult if future social work needs arise. CSW signing off.   Camellia Popescu, LCSW [phone] 

## 2018-08-02 DIAGNOSIS — S82851A Displaced trimalleolar fracture of right lower leg, initial encounter for closed fracture: Secondary | ICD-10-CM | POA: Insufficient documentation

## 2018-08-07 NOTE — Anesthesia Postprocedure Evaluation (Signed)
Anesthesia Post Note  Patient: [name]  Procedure(s) Performed: OPEN REDUCTION INTERNAL FIXATION (ORIF) ANKLE FRACTURE (Right Ankle)  Patient location during evaluation: PACU Anesthesia Type: General Level of consciousness: awake and alert Pain management: pain level controlled Vital Signs Assessment: post-procedure vital signs reviewed and stable Respiratory status: spontaneous breathing, nonlabored ventilation, respiratory function stable and patient connected to nasal cannula oxygen Cardiovascular status: blood pressure returned to baseline and stable Postop Assessment: no apparent nausea or vomiting Anesthetic complications: no     Last Vitals:  Vitals:   08/01/18 0733 08/01/18 1216  BP: 113/68 132/65  Pulse: 83 88  Resp: 18 18  Temp: 36.6 C 36.7 C  SpO2: 95% 94%    Last Pain:  Vitals:   08/01/18 1216  TempSrc: Oral  PainSc:                  Molli Barrows

## 2018-08-16 ENCOUNTER — Telehealth: Payer: Self-pay | Admitting: Physician Assistant

## 2018-08-16 NOTE — Telephone Encounter (Signed)
Judson Roch, Nurse Case Manager w/ [facility] [phone]  Ext [phone]  Judson Roch will be pt's [place] manager.   Pt had surgery 2 weeks ago on ankle. Has a f/u with surgeon Fri. ,12-20.  Thanks, [employer]

## 2018-08-17 NOTE — Telephone Encounter (Signed)
Noted, thanks!

## 2018-10-14 ENCOUNTER — Other Ambulatory Visit: Payer: Self-pay | Admitting: Physician Assistant

## 2018-10-14 DIAGNOSIS — G4700 Insomnia, unspecified: Secondary | ICD-10-CM

## 2018-12-06 DIAGNOSIS — S82851D Displaced trimalleolar fracture of right lower leg, subsequent encounter for closed fracture with routine healing: Secondary | ICD-10-CM | POA: Diagnosis not present

## 2019-01-08 DIAGNOSIS — S82851D Displaced trimalleolar fracture of right lower leg, subsequent encounter for closed fracture with routine healing: Secondary | ICD-10-CM | POA: Diagnosis not present

## 2019-05-16 ENCOUNTER — Other Ambulatory Visit: Payer: Self-pay | Admitting: Physician Assistant

## 2019-05-16 DIAGNOSIS — G4700 Insomnia, unspecified: Secondary | ICD-10-CM

## 2019-06-05 DIAGNOSIS — Z23 Encounter for immunization: Secondary | ICD-10-CM | POA: Diagnosis not present

## 2020-01-30 ENCOUNTER — Ambulatory Visit: Payer: Self-pay | Admitting: *Deleted

## 2020-01-30 ENCOUNTER — Encounter: Payer: Self-pay | Admitting: Physician Assistant

## 2020-01-30 ENCOUNTER — Ambulatory Visit (INDEPENDENT_AMBULATORY_CARE_PROVIDER_SITE_OTHER): Payer: 59 | Admitting: Physician Assistant

## 2020-01-30 VITALS — Temp 98.1°F

## 2020-01-30 DIAGNOSIS — R0981 Nasal congestion: Secondary | ICD-10-CM | POA: Diagnosis not present

## 2020-01-30 DIAGNOSIS — R05 Cough: Secondary | ICD-10-CM | POA: Diagnosis not present

## 2020-01-30 DIAGNOSIS — R059 Cough, unspecified: Secondary | ICD-10-CM

## 2020-01-30 NOTE — Telephone Encounter (Signed)
Patient is calling to report cough and SOB- end of April after COVID vaccine patient developed symptoms and they have continued. Patient started coughing with congestion- then developed laryngitis. Now has congestion in ears with a lot of pressure..Patient still has cough- some SOB with cough at times. Due to symptoms- virtual visit scheduled.  Reason for Disposition . Cough has been present for > 3 weeks  Answer Assessment - Initial Assessment Questions 1. LOCATION: "Which ear is involved?"       Both ears 2. SENSATION: "Describe how the ear feels."      Feels stopped up- decreased hearing 3. ONSET:  "When did the ear symptoms start?"       2 1/2 weeks  4. PAIN: "Do you also have an earache?" If so, ask: "How bad is it?" (Scale 1-10; or mild, moderate, severe)     pressure 5. CAUSE: "What do you think is causing the ear congestion?"     congestion 6. URI: "Do you have a runny nose or cough?"      cough 7. NASAL ALLERGIES: "Are there symptoms of hay fever, such as sneezing or a clear nasal discharge?"     some 8. PREGNANCY: "Is there any chance you are pregnant?" "When was your last menstrual period?"     n/a  Answer Assessment - Initial Assessment Questions 1. ONSET: "When did the cough begin?"      End of April 2. SEVERITY: "How bad is the cough today?"      Can cough mucus- mostly dry after 3. RESPIRATORY DISTRESS: "Describe your breathing."      Some SOB with cough and sitting working at times 4. FEVER: "Do you have a fever?" If so, ask: "What is your temperature, how was it measured, and when did it start?"     no 5. SPUTUM: "Describe the color of your sputum" (clear, white, yellow, green)     foamy 6. HEMOPTYSIS: "Are you coughing up any blood?" If so ask: "How much?" (flecks, streaks, tablespoons, etc.)     no 7. CARDIAC HISTORY: "Do you have any history of heart disease?" (e.g., heart attack, congestive heart failure)      no 8. LUNG HISTORY: "Do you have any history of  lung disease?"  (e.g., pulmonary embolus, asthma, emphysema)     TB 9. PE RISK FACTORS: "Do you have a history of blood clots?" (or: recent major surgery, recent prolonged travel, bedridden)     no 10. OTHER SYMPTOMS: "Do you have any other symptoms?" (e.g., runny nose, wheezing, chest pain)       Feels L lung sensation- TB history 11. PREGNANCY: "Is there any chance you are pregnant?" "When was your last menstrual period?"       n/a 12. TRAVEL: "Have you traveled out of the country in the last month?" (e.g., travel history, exposures)       no  Protocols used: [place], [place]

## 2020-01-30 NOTE — Progress Notes (Signed)
MyChart Video Visit    Virtual Visit via Video Note   This visit type was conducted due to national recommendations for restrictions regarding the COVID-19 Pandemic (e.g. social distancing) in an effort to limit this patient's exposure and mitigate transmission in our community. This patient is at least at moderate risk for complications without adequate follow up. This format is felt to be most appropriate for this patient at this time. Physical exam was limited by quality of the video and audio technology used for the visit.   Patient location: Home Provider location: Office    Patient: Terri Weaver   DOB: Oct 22, 1955   64 y.o. Female  MRN: [id number] Visit Date: 01/30/2020  Today's healthcare provider: Trinna Post, PA-C   Chief Complaint  Patient presents with  . Cough   Subjective    HPI Cough This is a new problem. Episode onset: 1 month ago after getting the 2nd dose of COVID vaccine. The problem has been gradually improving. The cough is productive of sputum (clear-white colored mucus). Associated symptoms include ear congestion, ear pain, postnasal drip and a sore throat (which has since onset improved). Pertinent negatives include no chest pain, chills, fever, headaches, hemoptysis, nasal congestion, rhinorrhea, shortness of breath, sweats or wheezing. Treatments tried: OTC cough medication, warm salt water gargles and cough drops. The treatment provided mild relief. Reports she had laryngitis at that time which improved over a week or so. She has a history of TB remotely.     Medications: Outpatient Medications Prior to Visit  Medication Sig  . BIOTIN PO Take 1 tablet by mouth daily.   . Calcium-Vitamin D-Vitamin K 500-100-40 MG-UNT-MCG CHEW Chew 1 tablet by mouth daily.   . COLLAGEN PO Take 1 tablet by mouth daily.   . Multiple Vitamins-Minerals (MULTIVITAMIN GUMMIES ADULT) CHEW Chew 1 tablet by mouth daily.   . traZODone (DESYREL) 50 MG tablet TAKE 1/2 TO  1 TABLET (25-50 MG TOTAL) BY MOUTH AT BEDTIME AS NEEDED FOR SLEEP.  Marland Kitchen aspirin EC 325 MG tablet Take 1 tablet (325 mg total) by mouth daily. (Patient not taking: Reported on 01/30/2020)  . loratadine (CLARITIN) 10 MG tablet Take 10 mg by mouth daily as needed.   . Misc Natural Products (SUPER GREENS) POWD Take by mouth daily.  . [DISCONTINUED] oxyCODONE (OXY IR/ROXICODONE) 5 MG immediate release tablet Take 1-2 tablets (5-10 mg total) by mouth every 4 (four) hours as needed for moderate pain (pain score 4-6). (Patient not taking: Reported on 01/30/2020)   No facility-administered medications prior to visit.    Review of Systems  Constitutional: Negative for appetite change, chills and fever.  HENT: Positive for congestion, ear pain, postnasal drip, sinus pressure and sore throat (improved). Negative for rhinorrhea, sinus pain and sneezing.   Respiratory: Positive for cough (productive with white mucus). Negative for chest tightness, shortness of breath and wheezing.   Cardiovascular: Negative for chest pain and palpitations.  Gastrointestinal: Negative for abdominal pain, nausea and vomiting.      Objective    Temp 98.1 F (36.7 C) (Oral)    Physical Exam Constitutional:      Appearance: Normal appearance.  Pulmonary:     Effort: Pulmonary effort is normal. No respiratory distress.  Neurological:     Mental Status: He is alert.        Assessment & Plan    1. Sinus congestion  Advised 2nd gen antihistamine daily and flonase to help with congestion, PND and cough. CXR  as below to r/o lung pathology.  2. Cough  - DG Chest 2 View; Future    Return if symptoms worsen or fail to improve.     I discussed the assessment and treatment plan with the patient. The patient was provided an opportunity to ask questions and all were answered. The patient agreed with the plan and demonstrated an understanding of the instructions.   The patient was advised to call back or seek an  in-person evaluation if the symptoms worsen or if the condition fails to improve as anticipated.  ITrinna Post, PA-C, have reviewed all documentation for this visit. The documentation on 01/31/20 for the exam, diagnosis, procedures, and orders are all accurate and complete.   Paulene Floor [facility] [phone] (phone) [phone] (fax)  Person

## 2020-01-31 NOTE — Patient Instructions (Signed)

## 2020-02-03 ENCOUNTER — Other Ambulatory Visit: Payer: Self-pay

## 2020-02-03 ENCOUNTER — Ambulatory Visit
Admission: RE | Admit: 2020-02-03 | Discharge: 2020-02-03 | Disposition: A | Discharge status: Home / Self Care | Payer: 59 | Attending: Physician Assistant | Admitting: Physician Assistant

## 2020-02-03 ENCOUNTER — Ambulatory Visit
Admission: RE | Admit: 2020-02-03 | Discharge: 2020-02-03 | Disposition: A | Discharge status: Home / Self Care | Payer: 59 | Source: Ambulatory Visit | Attending: Physician Assistant | Admitting: Physician Assistant

## 2020-02-03 ENCOUNTER — Telehealth: Payer: Self-pay

## 2020-02-03 DIAGNOSIS — R05 Cough: Secondary | ICD-10-CM | POA: Diagnosis not present

## 2020-02-03 DIAGNOSIS — R059 Cough, unspecified: Secondary | ICD-10-CM

## 2020-02-03 NOTE — Telephone Encounter (Signed)
Patient was advised and states she scheduled appointment to come see you on Thursday morning. Just a [place]

## 2020-02-03 NOTE — Telephone Encounter (Signed)
-----   Message from Trinna Post, [place] sent at 02/03/2020  1:29 PM EDT ----- CXR normal. Mild hyperinflation likely associated with chronic changes from smoking.

## 2020-02-05 NOTE — Progress Notes (Signed)
Complete physical exam   Patient: Terri Weaver   DOB: 1956/06/10   64 y.o. Female  MRN: [id number] Visit Date: 02/06/2020  Today's healthcare provider: Trinna Post, PA-C   Chief Complaint  Patient presents with  . Annual Exam  I,Adriana M Pollak,acting as a scribe for Trinna Post, PA-C.,have documented all relevant documentation on the behalf of Trinna Post, PA-C,as directed by  Trinna Post, PA-C while in the presence of Trinna Post, PA-C.  Subjective    Terri Weaver is a 64 y.o. female who presents today for a complete physical exam.  She reports consuming a general and low sodium diet. Home exercise routine includes walking. She generally feels fairly well. She reports sleeping fairly well. She does have additional problems to discuss today.  HPI  Patient states she is going to stop smoking and drink decaffeinated coffee. Patient reports heart racing at times and states that she is having stress related issues with work. She is interested in starting anxiety medications. Also reports intermittent palpitations.   No past medical history on file. Past Surgical History:  Procedure Laterality Date  . ABDOMINAL HYSTERECTOMY    . COLONOSCOPY WITH PROPOFOL N/A 04/26/2018   Procedure: COLONOSCOPY WITH PROPOFOL;  Surgeon: Lin Landsman, MD;  Location: [facility] ENDOSCOPY;  Service: Gastroenterology;  Laterality: N/A;  . ORIF ANKLE FRACTURE Right 07/31/2018   Procedure: OPEN REDUCTION INTERNAL FIXATION (ORIF) ANKLE FRACTURE;  Surgeon: Corky Mull, MD;  Location: [facility] ORS;  Service: Orthopedics;  Laterality: Right;  . TONSILLECTOMY     Social History   Socioeconomic History  . Marital status: Married    Spouse name: Not on file  . Number of children: Not on file  . Years of education: Not on file  . Highest education level: Not on file  Occupational History  . Not on file  Tobacco Use  . Smoking status: Current Every Day Smoker    Packs/day:  1.00    Types: Cigarettes  . Smokeless tobacco: Never Used  Vaping Use  . Vaping Use: Never used  Substance and Sexual Activity  . Alcohol use: No  . Drug use: No  . Sexual activity: Yes    Partners: Male  Other Topics Concern  . Not on file  Social History Narrative  . Not on file   Social Determinants of Health   Financial Resource Strain:   . Difficulty of Paying Living Expenses:   Food Insecurity:   . Worried About Charity fundraiser in the Last Year:   . Arboriculturist in the Last Year:   Transportation Needs:   . Film/video editor (Medical):   Marland Kitchen Lack of Transportation (Non-Medical):   Physical Activity:   . Days of Exercise per Week:   . Minutes of Exercise per Session:   Stress:   . Feeling of Stress :   Social Connections:   . Frequency of Communication with Friends and Family:   . Frequency of Social Gatherings with Friends and Family:   . Attends Religious Services:   . Active Member of Clubs or Organizations:   . Attends Archivist Meetings:   Marland Kitchen Marital Status:   Intimate Partner Violence:   . Fear of Current or Ex-Partner:   . Emotionally Abused:   Marland Kitchen Physically Abused:   . Sexually Abused:    Family Status  Relation Name Status  . Mother  Deceased  . Father  Deceased  .  Sister  Alive  . Mat Aunt  (Not Specified)   Family History  Problem Relation Age of Onset  . Stomach cancer Mother   . Prostate cancer Father   . COPD Father   . Heart attack Sister   . Breast cancer Maternal Aunt    Allergies  Allergen Reactions  . Chantix  [Varenicline] Itching  . Penicillins     Patient Care Team: Paulene Floor as PCP - General (Physician Assistant)   Medications: Outpatient Medications Prior to Visit  Medication Sig  . BIOTIN PO Take 1 tablet by mouth daily.   . Calcium-Vitamin D-Vitamin K 500-100-40 MG-UNT-MCG CHEW Chew 1 tablet by mouth daily.   . COLLAGEN PO Take 1 tablet by mouth daily.   . Fluticasone Propionate  (FLONASE ALLERGY RELIEF NA) Place into the nose.  . loratadine (CLARITIN) 10 MG tablet Take 10 mg by mouth daily as needed.   . Multiple Vitamins-Minerals (MULTIVITAMIN GUMMIES ADULT) CHEW Chew 1 tablet by mouth daily.   Marland Kitchen aspirin EC 325 MG tablet Take 1 tablet (325 mg total) by mouth daily. (Patient not taking: Reported on 01/30/2020)  . Misc Natural Products (SUPER GREENS) POWD Take by mouth daily. (Patient not taking: Reported on 02/06/2020)  . traZODone (DESYREL) 50 MG tablet TAKE 1/2 TO 1 TABLET (25-50 MG TOTAL) BY MOUTH AT BEDTIME AS NEEDED FOR SLEEP.   No facility-administered medications prior to visit.    Review of Systems  Constitutional: Negative.   HENT: Positive for congestion and tinnitus.   Eyes: Negative.   Cardiovascular: Negative.   Gastrointestinal: Negative.   Endocrine: Negative.   Genitourinary: Negative.   Musculoskeletal: Negative.   Skin: Negative.   Allergic/Immunologic: Negative.   Neurological: Positive for light-headedness.  Hematological: Negative.   Psychiatric/Behavioral: Negative.       Objective    BP 138/88 (BP Location: Left Arm, Patient Position: Sitting, Cuff Size: Normal)   Pulse (!) 102   Temp (!) 96.9 F (36.1 C) (Temporal)   Ht 5\' 3"  (1.6 m)   Wt 141 lb (64 kg)   SpO2 97%   BMI 24.98 kg/m    Physical Exam Constitutional:      Appearance: Normal appearance.  HENT:     Right Ear: There is impacted cerumen.     Left Ear: There is impacted cerumen.  Cardiovascular:     Rate and Rhythm: Normal rate and regular rhythm.     Pulses: Normal pulses.     Heart sounds: Normal heart sounds.  Pulmonary:     Effort: Pulmonary effort is normal.     Breath sounds: Normal breath sounds.  Abdominal:     General: Abdomen is flat. Bowel sounds are normal.     Palpations: Abdomen is soft.  Skin:    General: Skin is warm and dry.  Neurological:     General: No focal deficit present.     Mental Status: She is alert and oriented to person,  place, and time.  Psychiatric:        Mood and Affect: Mood normal.        Behavior: Behavior normal.       Depression Screen  PHQ 2/9 Scores 02/06/2020 04/06/2018  PHQ - 2 Score 0 0  PHQ- 9 Score 0 0    No results found for any visits on 02/06/20.  Assessment & Plan    1. Annual physical exam  - TSH - Lipid panel - Comprehensive metabolic panel - CBC with Differential/Platelet  2. Prediabetes Patient's A1C was elevated previously and going to monitor A1C  - Hemoglobin A1c  3. Adenomatous polyp of sigmoid colon Patient had colonoscopy in 2019 that showed multiple polyps and a referral was placed for GI as below. - Ambulatory referral to Gastroenterology  4. Palpitations Patient reports it feels like her heart is racing at time. EKG normal. Offered referral to cardiology.   - EKG 12-Lead  5. Anxiety  - escitalopram (LEXAPRO) 10 MG tablet; Take 1 tablet (10 mg total) by mouth daily.  Dispense: 90 tablet; Refill: 0 - hydrOXYzine (ATARAX/VISTARIL) 10 MG tablet; Take 1 tablet (10 mg total) by mouth 3 (three) times daily as needed.  Dispense: 30 tablet; Refill: 0  6. Need for diphtheria-tetanus-pertussis (Tdap) vaccine  - Tdap vaccine greater than or equal to 7yo IM   Routine Health Maintenance and Physical Exam  Exercise Activities and Dietary recommendations Goals   None     Immunization History  Administered Date(s) Administered  . Influenza-Unspecified 06/05/2019  . PFIZER SARS-COV-2 Vaccination 12/03/2019, 12/24/2019  . Tdap 12/06/2006, 02/06/2020    Health Maintenance  Topic Date Due  . COLONOSCOPY  04/27/2019  . INFLUENZA VACCINE  03/29/2020  . MAMMOGRAM  04/27/2020  . TETANUS/TDAP  02/05/2030  . COVID-19 Vaccine  Completed  . Hepatitis C Screening  Completed  . HIV Screening  Completed    Discussed health benefits of physical activity, and encouraged her to engage in regular exercise appropriate for her age and condition.    Return in about  1 month (around 03/07/2020).     ITrinna Post, PA-C, have reviewed all documentation for this visit. The documentation on 02/06/20 for the exam, diagnosis, procedures, and orders are all accurate and complete.    Paulene Floor  [facility] [phone] (phone) [phone] (fax)  [place]

## 2020-02-06 ENCOUNTER — Encounter: Payer: Self-pay | Admitting: Physician Assistant

## 2020-02-06 ENCOUNTER — Other Ambulatory Visit: Payer: Self-pay

## 2020-02-06 ENCOUNTER — Ambulatory Visit (INDEPENDENT_AMBULATORY_CARE_PROVIDER_SITE_OTHER): Payer: 59 | Admitting: Physician Assistant

## 2020-02-06 VITALS — BP 138/88 | HR 102 | Temp 96.9°F | Ht 63.0 in | Wt 141.0 lb

## 2020-02-06 DIAGNOSIS — F419 Anxiety disorder, unspecified: Secondary | ICD-10-CM

## 2020-02-06 DIAGNOSIS — Z23 Encounter for immunization: Secondary | ICD-10-CM

## 2020-02-06 DIAGNOSIS — R002 Palpitations: Secondary | ICD-10-CM | POA: Diagnosis not present

## 2020-02-06 DIAGNOSIS — D125 Benign neoplasm of sigmoid colon: Secondary | ICD-10-CM | POA: Diagnosis not present

## 2020-02-06 DIAGNOSIS — R69 Illness, unspecified: Secondary | ICD-10-CM | POA: Diagnosis not present

## 2020-02-06 DIAGNOSIS — Z0000 Encounter for general adult medical examination without abnormal findings: Secondary | ICD-10-CM

## 2020-02-06 DIAGNOSIS — R7303 Prediabetes: Secondary | ICD-10-CM | POA: Diagnosis not present

## 2020-02-06 MED ORDER — HYDROXYZINE HCL 10 MG PO TABS: 10.0000 mg | ORAL_TABLET | Freq: Three times a day (TID) | ORAL | 0 refills | Status: DC | PRN

## 2020-02-06 MED ORDER — ESCITALOPRAM OXALATE 10 MG PO TABS: 10.0000 mg | ORAL_TABLET | Freq: Every day | ORAL | 0 refills | Status: DC

## 2020-02-07 LAB — COMPREHENSIVE METABOLIC PANEL
ALT: 13 IU/L (ref 0–32)
AST: 16 IU/L (ref 0–40)
Albumin/Globulin Ratio: 1.7 (ref 1.2–2.2)
Albumin: 4.5 g/dL (ref 3.8–4.8)
Alkaline Phosphatase: 101 IU/L (ref 48–121)
BUN/Creatinine Ratio: 15 (ref 12–28)
BUN: 12 mg/dL (ref 8–27)
Bilirubin Total: 0.2 mg/dL (ref 0.0–1.2)
CO2: 24 mmol/L (ref 20–29)
Calcium: 9.6 mg/dL (ref 8.7–10.3)
Chloride: 104 mmol/L (ref 96–106)
Creatinine, Ser: 0.81 mg/dL (ref 0.57–1.00)
GFR calc Af Amer: 90 mL/min/{1.73_m2} (ref >59)
GFR calc non Af Amer: 78 mL/min/{1.73_m2} (ref >59)
Globulin, Total: 2.7 g/dL (ref 1.5–4.5)
Glucose: 127 mg/dL — ABNORMAL HIGH (ref 65–99)
Potassium: 4.8 mmol/L (ref 3.5–5.2)
Sodium: 142 mmol/L (ref 134–144)
Total Protein: 7.2 g/dL (ref 6.0–8.5)

## 2020-02-07 LAB — CBC WITH DIFFERENTIAL/PLATELET
Basophils Absolute: 0.1 10*3/uL (ref 0.0–0.2)
Basos: 1 %
EOS (ABSOLUTE): 0.2 10*3/uL (ref 0.0–0.4)
Eos: 2 %
Hematocrit: 46.6 % (ref 34.0–46.6)
Hemoglobin: 15.7 g/dL (ref 11.1–15.9)
Immature Grans (Abs): 0.1 10*3/uL (ref 0.0–0.1)
Immature Granulocytes: 1 %
Lymphocytes Absolute: 1.3 10*3/uL (ref 0.7–3.1)
Lymphs: 14 %
MCH: 32.2 pg (ref 26.6–33.0)
MCHC: 33.7 g/dL (ref 31.5–35.7)
MCV: 96 fL (ref 79–97)
Monocytes Absolute: 0.6 10*3/uL (ref 0.1–0.9)
Monocytes: 6 %
Neutrophils Absolute: 7.5 10*3/uL — ABNORMAL HIGH (ref 1.4–7.0)
Neutrophils: 76 %
Platelets: 294 10*3/uL (ref 150–450)
RBC: 4.87 x10E6/uL (ref 3.77–5.28)
RDW: 12.7 % (ref 11.7–15.4)
WBC: 9.6 10*3/uL (ref 3.4–10.8)

## 2020-02-07 LAB — LIPID PANEL
Chol/HDL Ratio: 3.2 ratio (ref 0.0–4.4)
Cholesterol, Total: 234 mg/dL — ABNORMAL HIGH (ref 100–199)
HDL: 74 mg/dL (ref >39)
LDL Chol Calc (NIH): 149 mg/dL — ABNORMAL HIGH (ref 0–99)
Triglycerides: 66 mg/dL (ref 0–149)
VLDL Cholesterol Cal: 11 mg/dL (ref 5–40)

## 2020-02-07 LAB — TSH: TSH: 0.747 u[IU]/mL (ref 0.450–4.500)

## 2020-02-07 LAB — HEMOGLOBIN A1C
Est. average glucose Bld gHb Est-mCnc: 111 mg/dL
Hgb A1c MFr Bld: 5.5 % (ref 4.8–5.6)

## 2020-02-19 ENCOUNTER — Other Ambulatory Visit: Payer: Self-pay

## 2020-02-19 DIAGNOSIS — D125 Benign neoplasm of sigmoid colon: Secondary | ICD-10-CM

## 2020-02-19 NOTE — Progress Notes (Signed)
Discussed colonoscopy referral with patient.  She is aware that its time for her to schedule her repeat with Dr. Marius Ditch, but she needs to coordinate this with her husband.    She will call the office back to schedule.  I will go ahead and send her instructions in advance for her to read over.  Thanks,  [place], [place]

## 2020-04-06 NOTE — Progress Notes (Deleted)
 {  This patient's chart is due for periodic physician review. Please check 'Cosign Required' and forward to your supervising physician.:1}  Established patient visit   Patient: Terri Weaver   DOB: 1955-10-24   64 y.o. Female  MRN: [id number] Visit Date: 04/07/2020  Today's healthcare provider: Trinna Post, PA-C   No chief complaint on file.  Subjective    HPI  Anxiety, Follow-up  She was last seen for anxiety 2 months ago. Changes made at last visit include starting on Lexapro 10mg  daily and hydroxyzine 10mg  TID as needed.   She reports {excellent/good/fair/poor:19665} compliance with treatment. She reports {good/fair/poor:18685} tolerance of treatment. She {is/is not:21021397} having side effects. {document side effects if present:1}  She feels her anxiety is {Desc; severity:60313} and {improved/worse/unchanged:3041574} since last visit.  Symptoms: {Yes/No:20286} chest pain {Yes/No:20286} difficulty concentrating  {Yes/No:20286} dizziness {Yes/No:20286} fatigue  {Yes/No:20286} feelings of losing control {Yes/No:20286} insomnia  {Yes/No:20286} irritable {Yes/No:20286} palpitations  {Yes/No:20286} panic attacks {Yes/No:20286} racing thoughts  {Yes/No:20286} shortness of breath {Yes/No:20286} sweating  {Yes/No:20286} tremors/shakes    GAD-7 Results No flowsheet data found.  PHQ-9 Scores PHQ9 SCORE ONLY 02/06/2020 04/06/2018  PHQ-9 Total Score 0 0     {Show patient history (optional):23778::" "}   Medications: Outpatient Medications Prior to Visit  Medication Sig  . aspirin EC 325 MG tablet Take 1 tablet (325 mg total) by mouth daily. (Patient not taking: Reported on 01/30/2020)  . BIOTIN PO Take 1 tablet by mouth daily.   . Calcium-Vitamin D-Vitamin K 500-100-40 MG-UNT-MCG CHEW Chew 1 tablet by mouth daily.   . COLLAGEN PO Take 1 tablet by mouth daily.   Marland Kitchen escitalopram (LEXAPRO) 10 MG tablet Take 1 tablet (10 mg total) by mouth daily.  . Fluticasone  Propionate (FLONASE ALLERGY RELIEF NA) Place into the nose.  . hydrOXYzine (ATARAX/VISTARIL) 10 MG tablet Take 1 tablet (10 mg total) by mouth 3 (three) times daily as needed.  . loratadine (CLARITIN) 10 MG tablet Take 10 mg by mouth daily as needed.   . Misc Natural Products (SUPER GREENS) POWD Take by mouth daily. (Patient not taking: Reported on 02/06/2020)  . Multiple Vitamins-Minerals (MULTIVITAMIN GUMMIES ADULT) CHEW Chew 1 tablet by mouth daily.   . traZODone (DESYREL) 50 MG tablet TAKE 1/2 TO 1 TABLET (25-50 MG TOTAL) BY MOUTH AT BEDTIME AS NEEDED FOR SLEEP.   No facility-administered medications prior to visit.    Review of Systems  Constitutional: Negative.   Respiratory: Negative.   Cardiovascular: Negative.   Musculoskeletal: Negative.   Neurological: Negative.   Psychiatric/Behavioral: Negative.     {Heme  Chem  Endocrine  Serology  Results Review (optional):23779::" "}  Objective    There were no vitals taken for this visit. {Show previous vital signs (optional):23777::" "}  Physical Exam  ***  No results found for any visits on 04/07/20.  Assessment & Plan     ***  No follow-ups on file.      {provider attestation***:1}   Paulene Floor  [facility] [phone] (phone) [phone] (fax)  [place]

## 2020-04-07 ENCOUNTER — Ambulatory Visit: Payer: Self-pay | Admitting: Physician Assistant

## 2020-04-09 ENCOUNTER — Ambulatory Visit: Payer: Self-pay | Admitting: Physician Assistant

## 2020-05-03 ENCOUNTER — Other Ambulatory Visit: Payer: Self-pay | Admitting: Physician Assistant

## 2020-05-03 DIAGNOSIS — F419 Anxiety disorder, unspecified: Secondary | ICD-10-CM

## 2020-05-03 NOTE — Telephone Encounter (Signed)
Requested Prescriptions  Pending Prescriptions Disp Refills  . escitalopram (LEXAPRO) 10 MG tablet [Pharmacy Med Name: ESCITALOPRAM 10 MG TABLET] 90 tablet 1    Sig: TAKE 1 TABLET BY MOUTH EVERY DAY     Psychiatry:  Antidepressants - SSRI Passed - 05/03/2020  9:22 AM      Passed - Valid encounter within last 6 months    Recent Outpatient Visits          2 months ago Annual physical exam   [facility] Carles Collet M, [place]   3 months ago Sinus congestion   [facility] [place], Wendee Beavers, [place]   1 year ago Prediabetes   [facility] [place], Wendee Beavers, [place]   2 years ago Annual physical exam   [facility] Trinna Post, [place]   2 years ago Carpal tunnel syndrome, right   [facility] Trinna Post, [place]      Future Appointments            In 2 days Trinna Post, PA-C [employer], [facility]           '

## 2020-05-05 ENCOUNTER — Other Ambulatory Visit: Payer: Self-pay

## 2020-05-05 ENCOUNTER — Encounter: Payer: Self-pay | Admitting: Physician Assistant

## 2020-05-05 ENCOUNTER — Ambulatory Visit (INDEPENDENT_AMBULATORY_CARE_PROVIDER_SITE_OTHER): Payer: 59 | Admitting: Physician Assistant

## 2020-05-05 VITALS — BP 128/88 | HR 77 | Temp 98.1°F | Wt 144.4 lb

## 2020-05-05 DIAGNOSIS — F419 Anxiety disorder, unspecified: Secondary | ICD-10-CM

## 2020-05-05 DIAGNOSIS — Z1231 Encounter for screening mammogram for malignant neoplasm of breast: Secondary | ICD-10-CM | POA: Diagnosis not present

## 2020-05-05 DIAGNOSIS — R69 Illness, unspecified: Secondary | ICD-10-CM | POA: Diagnosis not present

## 2020-05-05 NOTE — Progress Notes (Signed)
Established patient visit   Patient: Terri Weaver   DOB: 09-Feb-1956   64 y.o. Female  MRN: [id number] Visit Date: 05/05/2020  Today's healthcare provider: Trinna Post, PA-C   Chief Complaint  Patient presents with  . Anxiety  I,Linnie Mcglocklin M Bastian Andreoli,acting as a scribe for Trinna Post, PA-C.,have documented all relevant documentation on the behalf of Trinna Post, PA-C,as directed by  Trinna Post, PA-C while in the presence of Trinna Post, PA-C.  Subjective    HPI  Anxiety, Follow-up  She was last seen for anxiety 3 months ago. Changes made at last visit include starting Lexapro 10 mg daily.   She reports good compliance with treatment. She reports good tolerance of treatment. She is not having side effects.   She feels her anxiety is mild and Improved since last visit.  Symptoms: No chest pain No difficulty concentrating  No dizziness No fatigue  No feelings of losing control No insomnia  No irritable No palpitations  No panic attacks No racing thoughts  No shortness of breath No sweating  No tremors/shakes    GAD-7 Results GAD-7 Generalized Anxiety Disorder Screening Tool 05/05/2020  1. Feeling Nervous, Anxious, or on Edge 0  2. Not Being Able to Stop or Control Worrying 0  3. Worrying Too Much About Different Things 0  4. Trouble Relaxing 0  5. Being So Restless it's Hard To Sit Still 0  6. Becoming Easily Annoyed or Irritable 0  7. Feeling Afraid As If Something Awful Might Happen 0  Total GAD-7 Score 0  Difficulty At Work, Home, or Getting  Along With Others? Not difficult at all    PHQ-9 Scores PHQ9 SCORE ONLY 05/05/2020 02/06/2020 04/06/2018  PHQ-9 Total Score 0 0 0    ---------------------------------------------------------------------------------------------------      Medications: Outpatient Medications Prior to Visit  Medication Sig  . BIOTIN PO Take 1 tablet by mouth daily.   . Calcium-Vitamin D-Vitamin K 500-100-40  MG-UNT-MCG CHEW Chew 1 tablet by mouth daily.   . COLLAGEN PO Take 1 tablet by mouth daily.   Marland Kitchen escitalopram (LEXAPRO) 10 MG tablet TAKE 1 TABLET BY MOUTH EVERY DAY  . Fluticasone Propionate (FLONASE ALLERGY RELIEF NA) Place into the nose.  . hydrOXYzine (ATARAX/VISTARIL) 10 MG tablet Take 1 tablet (10 mg total) by mouth 3 (three) times daily as needed.  . loratadine (CLARITIN) 10 MG tablet Take 10 mg by mouth daily as needed.   . Multiple Vitamins-Minerals (MULTIVITAMIN GUMMIES ADULT) CHEW Chew 1 tablet by mouth daily.   . traZODone (DESYREL) 50 MG tablet TAKE 1/2 TO 1 TABLET (25-50 MG TOTAL) BY MOUTH AT BEDTIME AS NEEDED FOR SLEEP.  . [DISCONTINUED] aspirin EC 325 MG tablet Take 1 tablet (325 mg total) by mouth daily. (Patient not taking: Reported on 01/30/2020)  . [DISCONTINUED] Misc Natural Products (SUPER GREENS) POWD Take by mouth daily.  (Patient not taking: Reported on 05/05/2020)   No facility-administered medications prior to visit.    Review of Systems  Constitutional: Negative.   Respiratory: Negative.   Cardiovascular: Negative.   Psychiatric/Behavioral: Negative for agitation, confusion, decreased concentration, self-injury, sleep disturbance and suicidal ideas. The patient is not nervous/anxious.       Objective    BP 128/88 (BP Location: Right Arm, Patient Position: Sitting, Cuff Size: Normal)   Pulse 77   Temp 98.1 F (36.7 C) (Oral)   Wt 144 lb 6.4 oz (65.5 kg)   SpO2 95%  BMI 25.58 kg/m    Physical Exam Constitutional:      Appearance: Normal appearance. She is normal weight.  Cardiovascular:     Rate and Rhythm: Normal rate.     Heart sounds: Normal heart sounds.  Pulmonary:     Effort: Pulmonary effort is normal.     Breath sounds: Normal breath sounds.  Skin:    General: Skin is warm and dry.  Neurological:     General: No focal deficit present.     Mental Status: She is alert and oriented to person, place, and time.  Psychiatric:        Mood and  Affect: Mood normal.        Behavior: Behavior normal.       No results found for any visits on 05/05/20.  Assessment & Plan    1. Anxiety  Continue Lexapro 10 mg daily. Follow up once yearly for CPE and anxiety   2. Encounter for screening mammogram for malignant neoplasm of breast  [place] contact card given.   - MM Digital Screening; Future   Return in about 9 months (around 02/02/2021) for CPE.      ITrinna Post, PA-C, have reviewed all documentation for this visit. The documentation on 05/05/20 for the exam, diagnosis, procedures, and orders are all accurate and complete.  The entirety of the information documented in the History of Present Illness, Review of Systems and Physical Exam were personally obtained by me. Portions of this information were initially documented by [facility] and reviewed by me for thoroughness and accuracy.     Paulene Floor  [facility] [phone] (phone) [phone] (fax)  [place]

## 2020-05-05 NOTE — Patient Instructions (Signed)

## 2020-10-06 ENCOUNTER — Other Ambulatory Visit: Payer: Self-pay | Admitting: Physician Assistant

## 2020-10-06 DIAGNOSIS — F419 Anxiety disorder, unspecified: Secondary | ICD-10-CM

## 2020-11-09 ENCOUNTER — Other Ambulatory Visit: Payer: Self-pay | Admitting: Physician Assistant

## 2020-11-09 DIAGNOSIS — F419 Anxiety disorder, unspecified: Secondary | ICD-10-CM

## 2020-12-05 ENCOUNTER — Other Ambulatory Visit: Payer: Self-pay | Admitting: Physician Assistant

## 2020-12-05 DIAGNOSIS — F419 Anxiety disorder, unspecified: Secondary | ICD-10-CM

## 2020-12-07 NOTE — Telephone Encounter (Signed)
Courtesy refill. Attempted to contact patient to schedule future appt. No answer, unable to leave voicemail message. Mailbox is full. Patient needs appt

## 2021-02-05 ENCOUNTER — Other Ambulatory Visit: Payer: Self-pay

## 2021-02-05 DIAGNOSIS — F419 Anxiety disorder, unspecified: Secondary | ICD-10-CM

## 2021-02-05 MED ORDER — ESCITALOPRAM OXALATE 10 MG PO TABS: 1.0000 | ORAL_TABLET | Freq: Every day | ORAL | 0 refills | Status: DC

## 2021-02-05 NOTE — Telephone Encounter (Signed)
[facility] on [address] is requesting refills on Escitalopram 10 mg. # 30

## 2021-02-28 ENCOUNTER — Other Ambulatory Visit: Payer: Self-pay | Admitting: Family Medicine

## 2021-02-28 DIAGNOSIS — F419 Anxiety disorder, unspecified: Secondary | ICD-10-CM

## 2021-02-28 NOTE — Telephone Encounter (Signed)
Last refill of escitalopram: 02/05/21 #30. Notified pt via MyChart that she needs follow up appointment for further refills. Requested Prescriptions  Pending Prescriptions Disp Refills   escitalopram (LEXAPRO) 10 MG tablet [Pharmacy Med Name: ESCITALOPRAM 10 MG TABLET] 90 tablet 1    Sig: TAKE 1 TABLET BY MOUTH EVERY DAY      Psychiatry:  Antidepressants - SSRI Failed - 02/28/2021  1:31 PM      Failed - Valid encounter within last 6 months    Recent Outpatient Visits           9 months ago [place], Wendee Beavers, [place]   1 year ago Annual physical exam   [facility] [place], Wendee Beavers, [place]   1 year ago Sinus congestion   [facility] Carles Collet M, [place]   2 years ago Prediabetes   [place], Wendee Beavers, [place]   2 years ago Annual physical exam   [place], [place], [place]

## 2021-03-19 ENCOUNTER — Other Ambulatory Visit: Payer: Self-pay | Admitting: Family Medicine

## 2021-03-19 DIAGNOSIS — F419 Anxiety disorder, unspecified: Secondary | ICD-10-CM

## 2021-03-19 NOTE — Telephone Encounter (Signed)
   Notes to clinic:  REQUEST FOR 90 DAYS PRESCRIPTION. DX Code Needed.   Requested Prescriptions  Pending Prescriptions Disp Refills   escitalopram (LEXAPRO) 10 MG tablet [Pharmacy Med Name: ESCITALOPRAM 10 MG TABLET] 90 tablet 1    Sig: Take 1 tablet (10 mg total) by mouth daily. Please schedule office visit before any future refills.      Psychiatry:  Antidepressants - SSRI Failed - 03/19/2021  8:30 AM      Failed - Valid encounter within last 6 months    Recent Outpatient Visits           10 months ago [facility], Wendee Beavers, [place]   1 year ago Annual physical exam   [facility] Carles Collet M, [place]   1 year ago Sinus congestion   [facility] Carles Collet M, [place]   2 years ago Prediabetes   [place], Wendee Beavers, [place]   2 years ago Annual physical exam   [place], [place], [place]

## 2021-03-31 ENCOUNTER — Other Ambulatory Visit: Payer: Self-pay | Admitting: Family Medicine

## 2021-03-31 DIAGNOSIS — F419 Anxiety disorder, unspecified: Secondary | ICD-10-CM

## 2021-07-13 ENCOUNTER — Other Ambulatory Visit: Payer: Self-pay

## 2021-07-13 ENCOUNTER — Encounter (HOSPITAL_BASED_OUTPATIENT_CLINIC_OR_DEPARTMENT_OTHER): Payer: Self-pay

## 2021-07-13 ENCOUNTER — Emergency Department (HOSPITAL_BASED_OUTPATIENT_CLINIC_OR_DEPARTMENT_OTHER): Payer: Managed Care, Other (non HMO)

## 2021-07-13 ENCOUNTER — Emergency Department (HOSPITAL_BASED_OUTPATIENT_CLINIC_OR_DEPARTMENT_OTHER)
Admission: EM | Admit: 2021-07-13 | Discharge: 2021-07-13 | Disposition: A | Discharge status: Home / Self Care | Payer: Managed Care, Other (non HMO) | Attending: Emergency Medicine | Admitting: Emergency Medicine

## 2021-07-13 DIAGNOSIS — J3489 Other specified disorders of nose and nasal sinuses: Secondary | ICD-10-CM | POA: Diagnosis not present

## 2021-07-13 DIAGNOSIS — J069 Acute upper respiratory infection, unspecified: Secondary | ICD-10-CM

## 2021-07-13 DIAGNOSIS — R509 Fever, unspecified: Secondary | ICD-10-CM | POA: Insufficient documentation

## 2021-07-13 DIAGNOSIS — R059 Cough, unspecified: Secondary | ICD-10-CM | POA: Insufficient documentation

## 2021-07-13 DIAGNOSIS — F17210 Nicotine dependence, cigarettes, uncomplicated: Secondary | ICD-10-CM | POA: Insufficient documentation

## 2021-07-13 DIAGNOSIS — J029 Acute pharyngitis, unspecified: Secondary | ICD-10-CM | POA: Diagnosis not present

## 2021-07-13 DIAGNOSIS — R062 Wheezing: Secondary | ICD-10-CM | POA: Insufficient documentation

## 2021-07-13 DIAGNOSIS — Z20822 Contact with and (suspected) exposure to covid-19: Secondary | ICD-10-CM | POA: Insufficient documentation

## 2021-07-13 DIAGNOSIS — R0981 Nasal congestion: Secondary | ICD-10-CM | POA: Insufficient documentation

## 2021-07-13 DIAGNOSIS — R051 Acute cough: Secondary | ICD-10-CM

## 2021-07-13 LAB — RESP PANEL BY RT-PCR (FLU A&B, COVID) ARPGX2
Influenza A by PCR: NEGATIVE
Influenza B by PCR: NEGATIVE
SARS Coronavirus 2 by RT PCR: NEGATIVE

## 2021-07-13 MED ORDER — ALBUTEROL SULFATE HFA 108 (90 BASE) MCG/ACT IN AERS
2.0000 | INHALATION_SPRAY | RESPIRATORY_TRACT | Status: DC | PRN
  Administered 2021-07-13: 2 via RESPIRATORY_TRACT
  Filled 2021-07-13: qty 6.7

## 2021-07-13 MED ORDER — AZITHROMYCIN 250 MG PO TABS
500.0000 mg | ORAL_TABLET | Freq: Once | ORAL | Status: AC
  Administered 2021-07-13: 500 mg via ORAL
  Filled 2021-07-13: qty 2

## 2021-07-13 MED ORDER — ALBUTEROL SULFATE HFA 108 (90 BASE) MCG/ACT IN AERS: 2.0000 | INHALATION_SPRAY | RESPIRATORY_TRACT | 1 refills | Status: DC | PRN

## 2021-07-13 MED ORDER — GUAIFENESIN 100 MG/5ML PO LIQD
15.0000 mL | Freq: Once | ORAL | Status: AC
  Administered 2021-07-13: 15 mL via ORAL

## 2021-07-13 MED ORDER — AZITHROMYCIN 250 MG PO TABS: 250.0000 mg | ORAL_TABLET | Freq: Every day | ORAL | 0 refills | Status: AC

## 2021-07-13 NOTE — ED Triage Notes (Signed)
Shortness of breath started today with several history of cough and fever.

## 2021-07-13 NOTE — Discharge Instructions (Addendum)
It was our pleasure to provide your ER care today - we hope that you feel better.  Drink plenty of fluids/stay well hydrated. You may take a multi-symptom cold/flu medication such as mucinex or nyquil as need for symptom relief.  Use albuterol inhaler as need if wheezing. Avoid smoking.   Follow up with primary care doctor in the next 1-2 weeks if symptoms fail to improve/resolve.  Return to ER if worse, new symptoms, increased trouble breathing, or other concern.

## 2021-07-13 NOTE — ED Provider Notes (Addendum)
[facility] DEPT Provider Note   CSN: [id number] Arrival date & time: 07/13/21  1916     History Chief Complaint  Patient presents with   Influenza    Terri Weaver is a 65 y.o. female.  Pt c/o non prod cough, scratchy throat, congestion - symptoms acute onset in past few days, moderate, persistent. Low grade fever. Had mild body aches earlier, not currently. No severe headaches. No neck pain or stiffness. No sob. No abd pain or nvd. No dysuria or gu c/o. No rash. Has been vaccinated and boosted for covid. +smoker. Denies hx asthma or copd. No leg pain or swelling.  The history is provided by the patient and medical records.  Influenza Presenting symptoms: cough, fever, myalgias, rhinorrhea and sore throat   Presenting symptoms: no diarrhea, no headaches, no shortness of breath and no vomiting   Associated symptoms: nasal congestion   Associated symptoms: no neck stiffness       No past medical history on file.  Patient Active Problem List   Diagnosis Date Noted   Ankle fracture, right 07/31/2018   Adenomatous polyp of sigmoid colon 07/20/2018   Encounter for screening colonoscopy    Lyme disease 03/27/2017   Tuberculosis 03/21/2017   Cyst of ovary 03/21/2017   Cannot sleep 03/21/2017    Past Surgical History:  Procedure Laterality Date   ABDOMINAL HYSTERECTOMY     COLONOSCOPY WITH PROPOFOL N/A 04/26/2018   Procedure: COLONOSCOPY WITH PROPOFOL;  Surgeon: Lin Landsman, MD;  Location: [facility] ENDOSCOPY;  Service: Gastroenterology;  Laterality: N/A;   ORIF ANKLE FRACTURE Right 07/31/2018   Procedure: OPEN REDUCTION INTERNAL FIXATION (ORIF) ANKLE FRACTURE;  Surgeon: Corky Mull, MD;  Location: [facility] ORS;  Service: Orthopedics;  Laterality: Right;   TONSILLECTOMY       OB History   No obstetric history on file.     Family History  Problem Relation Age of Onset   Stomach cancer Mother    Prostate cancer Father    COPD Father     Heart attack Sister    Breast cancer Maternal Aunt     Social History   Tobacco Use   Smoking status: Every Day    Packs/day: 1.00    Types: Cigarettes   Smokeless tobacco: Never  Vaping Use   Vaping Use: Never used  Substance Use Topics   Alcohol use: No   Drug use: No    Home Medications Prior to Admission medications   Medication Sig Start Date End Date Taking? Authorizing Provider  BIOTIN PO Take 1 tablet by mouth daily.     [provider]  Calcium-Vitamin D-Vitamin K 500-100-40 MG-UNT-MCG CHEW Chew 1 tablet by mouth daily.     [provider]  COLLAGEN PO Take 1 tablet by mouth daily.     [provider]  escitalopram (LEXAPRO) 10 MG tablet Take 1 tablet (10 mg total) by mouth daily. Please schedule office visit before any future refills. 03/02/21   Chrismon, Vickki Muff, PA-C  Fluticasone Propionate (FLONASE ALLERGY RELIEF NA) Place into the nose.    [provider]  hydrOXYzine (ATARAX/VISTARIL) 10 MG tablet Take 1 tablet (10 mg total) by mouth 3 (three) times daily as needed. 02/06/20   Trinna Post, PA-C  loratadine (CLARITIN) 10 MG tablet Take 10 mg by mouth daily as needed.     [provider]  Multiple Vitamins-Minerals (MULTIVITAMIN GUMMIES ADULT) CHEW Chew 1 tablet by mouth daily.  [provider]  traZODone (DESYREL) 50 MG tablet TAKE 1/2 TO 1 TABLET (25-50 MG TOTAL) BY MOUTH AT BEDTIME AS NEEDED FOR SLEEP. 05/16/19   Trinna Post, PA-C    Allergies    Chantix  [varenicline] and Penicillins  Review of Systems   Review of Systems  Constitutional:  Positive for fever.  HENT:  Positive for congestion, rhinorrhea and sore throat. Negative for sinus pain.   Eyes:  Negative for redness.  Respiratory:  Positive for cough. Negative for shortness of breath.   Cardiovascular:  Negative for chest pain and leg swelling.  Gastrointestinal:  Negative for abdominal pain, diarrhea and vomiting.  Genitourinary:   Negative for flank pain.  Musculoskeletal:  Positive for myalgias. Negative for neck pain and neck stiffness.  Skin:  Negative for rash.  Neurological:  Negative for headaches.  Hematological:  Does not bruise/bleed easily.  Psychiatric/Behavioral:  Negative for confusion.    Physical Exam Updated Vital Signs BP 124/85 (BP Location: Right Arm)   Pulse 99   Temp 98.1 F (36.7 C) (Oral)   Resp 20   Ht 1.613 m (5' 3.5")   Wt 63.5 kg   SpO2 95%   BMI 24.41 kg/m   Physical Exam Vitals and nursing note reviewed.  Constitutional:      Appearance: Normal appearance. She is well-developed.  HENT:     Head: Atraumatic.     Right Ear: Tympanic membrane normal.     Left Ear: Tympanic membrane normal.     Nose: Nose normal.     Mouth/Throat:     Mouth: Mucous membranes are moist.     Pharynx: Oropharynx is clear. No oropharyngeal exudate or posterior oropharyngeal erythema.  Eyes:     General: No scleral icterus.    Conjunctiva/sclera: Conjunctivae normal.     Pupils: Pupils are equal, round, and reactive to light.  Neck:     Trachea: No tracheal deviation.     Comments: No stiffness or rigidity.  Cardiovascular:     Rate and Rhythm: Normal rate and regular rhythm.     Pulses: Normal pulses.     Heart sounds: Normal heart sounds. No murmur heard.   No friction rub. No gallop.  Pulmonary:     Effort: Pulmonary effort is normal. No respiratory distress.     Comments: V mild wheezing.  Abdominal:     General: Bowel sounds are normal. There is no distension.     Palpations: Abdomen is soft.     Tenderness: There is no abdominal tenderness.     Comments: No hsm.   Genitourinary:    Comments: No cva tenderness.  Musculoskeletal:        General: No swelling or tenderness.     Cervical back: Normal range of motion and neck supple. No rigidity. No muscular tenderness.     Right lower leg: No edema.     Left lower leg: No edema.  Lymphadenopathy:     Cervical: No cervical  adenopathy.  Skin:    General: Skin is warm and dry.     Findings: No rash.  Neurological:     Mental Status: She is alert.     Comments: Alert, speech normal.   Psychiatric:        Mood and Affect: Mood normal.    ED Results / Procedures / Treatments   Labs (all labs ordered are listed, but only abnormal results are displayed) Results for orders placed or performed during the hospital encounter of  07/13/21  Resp Panel by RT-PCR (Flu A&B, Covid) Nasopharyngeal Swab   Specimen: Nasopharyngeal Swab; Nasopharyngeal(NP) swabs in vial transport medium  Result Value Ref Range   SARS Coronavirus 2 by RT PCR NEGATIVE NEGATIVE   Influenza A by PCR NEGATIVE NEGATIVE   Influenza B by PCR NEGATIVE NEGATIVE   DG Chest 2 View  Result Date: 07/13/2021 CLINICAL DATA:  Cough, sore throat EXAM: CHEST - 2 VIEW COMPARISON:  02/03/2020 FINDINGS: The heart size and mediastinal contours are within normal limits. Both lungs are clear. The visualized skeletal structures are unremarkable. IMPRESSION: No active cardiopulmonary disease. Electronically Signed   By: Fidela Salisbury M.D.   On: 07/13/2021 20:21     EKG None  Radiology DG Chest 2 View  Result Date: 07/13/2021 CLINICAL DATA:  Cough, sore throat EXAM: CHEST - 2 VIEW COMPARISON:  02/03/2020 FINDINGS: The heart size and mediastinal contours are within normal limits. Both lungs are clear. The visualized skeletal structures are unremarkable. IMPRESSION: No active cardiopulmonary disease. Electronically Signed   By: Fidela Salisbury M.D.   On: 07/13/2021 20:21    Procedures Procedures   Medications Ordered in ED Medications - No data to display  ED Course  I have reviewed the triage vital signs and the nursing notes.  Pertinent labs & imaging results that were available during my care of the patient were reviewed by me and considered in my medical decision making (see chart for details).    MDM Rules/Calculators/A&P                           Labs and imaging ordered.   Reviewed nursing notes and prior charts for additional history.   CXR reviewed/interpreted by me - no pna.   Labs reviewed/interpreted by me - covid and flu neg.   Robitussin po. Po fluids. Albuterol mdi treatment.   Zithromax po. ?bronchitis w bronscospasm, vs viral uri.   Pt currently is breathing comfortably, and appears stable for d/c.   Return precautions provided.    Final Clinical Impression(s) / ED Diagnoses Final diagnoses:  Cough    Rx / DC Orders ED Discharge Orders     None           Lajean Saver, MD 07/13/21 2045

## 2021-10-19 ENCOUNTER — Ambulatory Visit (INDEPENDENT_AMBULATORY_CARE_PROVIDER_SITE_OTHER): Payer: Managed Care, Other (non HMO) | Admitting: Physician Assistant

## 2021-10-19 ENCOUNTER — Other Ambulatory Visit: Payer: Self-pay

## 2021-10-19 ENCOUNTER — Encounter: Payer: Self-pay | Admitting: Physician Assistant

## 2021-10-19 VITALS — BP 130/71 | HR 108 | Resp 16 | Wt 154.9 lb

## 2021-10-19 DIAGNOSIS — F410 Panic disorder [episodic paroxysmal anxiety] without agoraphobia: Secondary | ICD-10-CM | POA: Insufficient documentation

## 2021-10-19 DIAGNOSIS — L821 Other seborrheic keratosis: Secondary | ICD-10-CM

## 2021-10-19 DIAGNOSIS — R739 Hyperglycemia, unspecified: Secondary | ICD-10-CM | POA: Diagnosis not present

## 2021-10-19 DIAGNOSIS — E782 Mixed hyperlipidemia: Secondary | ICD-10-CM

## 2021-10-19 DIAGNOSIS — M6289 Other specified disorders of muscle: Secondary | ICD-10-CM | POA: Diagnosis not present

## 2021-10-19 DIAGNOSIS — Z08 Encounter for follow-up examination after completed treatment for malignant neoplasm: Secondary | ICD-10-CM

## 2021-10-19 DIAGNOSIS — Z85038 Personal history of other malignant neoplasm of large intestine: Secondary | ICD-10-CM

## 2021-10-19 DIAGNOSIS — Z1231 Encounter for screening mammogram for malignant neoplasm of breast: Secondary | ICD-10-CM

## 2021-10-19 DIAGNOSIS — F411 Generalized anxiety disorder: Secondary | ICD-10-CM | POA: Insufficient documentation

## 2021-10-19 DIAGNOSIS — F430 Acute stress reaction: Secondary | ICD-10-CM

## 2021-10-19 MED ORDER — ESCITALOPRAM OXALATE 10 MG PO TABS: 10.0000 mg | ORAL_TABLET | Freq: Every day | ORAL | 1 refills | Status: DC

## 2021-10-19 NOTE — Progress Notes (Signed)
Established patient visit   Patient: Terri Weaver   DOB: 04-Sep-1955   66 y.o. Female  MRN: [id number] Visit Date: 10/19/2021  Today's healthcare provider: [name], PA-C   Chief Complaint  Patient presents with   Anxiety   Subjective    HPI   Terri Weaver is a 66 y/o female who presents today for anxiety.   She also reports a new spot on her right breast near her nipple for the last few weeks she wants checked. Denies pain, changes, erythema, discharge, breast masses.  She also reports a knot on the left side of the back of her neck, been present for years, unsure what it is. Sometimes tender. Denies changing, associated rash.   She reports cutting back on smoking, now smokes about 1/2 pack in a week.  Anxiety, Follow-up  She was last seen for anxiety 1 years ago. Changes made at last visit include none.   She reports poor compliance with treatment.Patient states that she d/c Lexapro fall of 2022 as she felt better. Since then her anxiety has gradually increased, but last week came to a peak as after a work meeting she felt very ill, dizzy, lightheaded, tingling in hands, hard to focus, slight headache. She has felt this way before, but never to the extent where she had to leave work. Reports lying down and breathing, feeling better in an hour. She took he BP around this time, 140/? Which worried her as she usually runs low.   She states stress brings on these symptoms and has before. Reports feeling similarly before lexapro, denies feeling any of these symptoms when she was taking the lexapro.  She feels her anxiety is moderate and Worse since last visit.  Symptoms:  No chest pain Yes difficulty concentrating  Yes dizziness No fatigue  No feelings of losing control No insomnia  No irritable No palpitations  Yes panic attacks No racing thoughts  No shortness of breath No sweating  No tremors/shakes    GAD-7 Results GAD-7 Generalized Anxiety Disorder  Screening Tool 10/19/2021 05/05/2020  1. Feeling Nervous, Anxious, or on Edge 2 0  2. Not Being Able to Stop or Control Worrying 0 0  3. Worrying Too Much About Different Things 0 0  4. Trouble Relaxing 1 0  5. Being So Restless it's Hard To Sit Still 1 0  6. Becoming Easily Annoyed or Irritable 0 0  7. Feeling Afraid As If Something Awful Might Happen 1 0  Total GAD-7 Score 5 0  Difficulty At Work, Home, or Getting  Along With Others? Somewhat difficult Not difficult at all    PHQ-9 Scores PHQ9 SCORE ONLY 10/19/2021 05/05/2020 02/06/2020  PHQ-9 Total Score 0 0 0   ---------------------------------------------------------------------------------------------------   Medications: Outpatient Medications Prior to Visit  Medication Sig Note   BIOTIN PO Take 1 tablet by mouth daily.     Calcium-Vitamin D-Vitamin K 500-100-40 MG-UNT-MCG CHEW Chew 1 tablet by mouth daily.     COLLAGEN PO Take 1 tablet by mouth daily.     Multiple Vitamins-Minerals (MULTIVITAMIN GUMMIES ADULT) CHEW Chew 1 tablet by mouth daily.     loratadine (CLARITIN) 10 MG tablet Take 10 mg by mouth daily as needed.     [DISCONTINUED] albuterol (VENTOLIN HFA) 108 (90 Base) MCG/ACT inhaler Inhale 2 puffs into the lungs every 4 (four) hours as needed for wheezing or shortness of breath.    [DISCONTINUED] escitalopram (LEXAPRO) 10 MG tablet Take 1 tablet (10 mg  total) by mouth daily. Please schedule office visit before any future refills.    [DISCONTINUED] Fluticasone Propionate (FLONASE ALLERGY RELIEF NA) Place into the nose.    [DISCONTINUED] hydrOXYzine (ATARAX/VISTARIL) 10 MG tablet Take 1 tablet (10 mg total) by mouth 3 (three) times daily as needed. 10/19/2021: caused fatigue   [DISCONTINUED] traZODone (DESYREL) 50 MG tablet TAKE 1/2 TO 1 TABLET (25-50 MG TOTAL) BY MOUTH AT BEDTIME AS NEEDED FOR SLEEP.    No facility-administered medications prior to visit.    Review of Systems  Constitutional:  Negative for fatigue and  fever.  Respiratory:  Negative for cough and shortness of breath.   Cardiovascular:  Negative for chest pain and leg swelling.  Gastrointestinal:  Negative for abdominal pain.  Neurological:  Positive for light-headedness. Negative for dizziness and headaches.  Psychiatric/Behavioral:  Positive for decreased concentration. The patient is nervous/anxious and is hyperactive.       Objective    BP 130/71    Pulse (!) 108    Resp 16    Wt 154 lb 14.4 oz (70.3 kg)    SpO2 100%    BMI 27.01 kg/m    Physical Exam Constitutional:      General: She is awake.     Appearance: She is well-developed.  HENT:     Head: Normocephalic.  Eyes:     Conjunctiva/sclera: Conjunctivae normal.  Cardiovascular:     Rate and Rhythm: Normal rate and regular rhythm.     Heart sounds: Normal heart sounds.  Pulmonary:     Effort: Pulmonary effort is normal.     Breath sounds: Normal breath sounds.  Chest:     Comments: Right breast w/ a small raised hyperpigmented lesion at 9:00 nipple. No nipple d/c.  Musculoskeletal:     Cervical back: No swelling.     Comments: Left side of cervical musculature with tightness no lymphadenopathy palpated.   Skin:    General: Skin is warm.  Neurological:     Mental Status: She is alert and oriented to person, place, and time.  Psychiatric:        Attention and Perception: Attention normal.        Mood and Affect: Mood normal.        Speech: Speech normal.        Behavior: Behavior is cooperative.     No results found for any visits on 10/19/21.  Assessment & Plan     Problem List Items Addressed This Visit       Musculoskeletal and Integument   Seborrheic keratosis    Directly adjacent to right nipple, 9:00. No concern for malignancy        Other   Panic attack as reaction to stress - Primary    Likely is GAD, but pt was conservative when responding to questionnaire. Will restart lexapro 10. Discussed counseling/therapy, pt states there is a free  program through her job she will try.  F/u 6-8 weeks      Relevant Medications   escitalopram (LEXAPRO) 10 MG tablet   Other Relevant Orders   Comprehensive Metabolic Panel (CMET)   CBC   TSH + free T4   Muscle tightness    Reassured no lymphadenopathy.       Other Visit Diagnoses     Hyperglycemia       Relevant Orders   Comprehensive Metabolic Panel (CMET)   HgB A1c   Encounter for follow-up surveillance of colon cancer  Relevant Orders   Ambulatory referral to Gastroenterology   Encounter for screening mammogram for breast cancer       Relevant Orders   MM 3D SCREEN BREAST BILATERAL   Moderate mixed hyperlipidemia not requiring statin therapy       Relevant Orders   Lipid Profile        Return in about 6 weeks (around 11/30/2021) for anxiety.      I, [name], PA-C have reviewed all documentation for this visit. The documentation on  10/19/2021 for the exam, diagnosis, procedures, and orders are all accurate and complete.    [name], PA-C  [facility] [phone] (phone) [phone] (fax)  [place]

## 2021-10-19 NOTE — Assessment & Plan Note (Addendum)
Likely is GAD, but pt was conservative when responding to questionnaire. Will restart lexapro 10. Discussed counseling/therapy, pt states there is a free program through her job she will try.  F/u 6-8 weeks

## 2021-10-19 NOTE — Assessment & Plan Note (Signed)
Directly adjacent to right nipple, 9:00. No concern for malignancy

## 2021-10-19 NOTE — Assessment & Plan Note (Signed)
Reassured no lymphadenopathy.

## 2021-10-20 ENCOUNTER — Encounter: Payer: Self-pay | Admitting: Physician Assistant

## 2021-10-20 ENCOUNTER — Ambulatory Visit
Admission: RE | Admit: 2021-10-20 | Discharge: 2021-10-20 | Disposition: A | Discharge status: Home / Self Care | Payer: Managed Care, Other (non HMO) | Source: Ambulatory Visit | Attending: Physician Assistant | Admitting: Physician Assistant

## 2021-10-20 ENCOUNTER — Other Ambulatory Visit: Payer: Self-pay

## 2021-10-20 DIAGNOSIS — Z1231 Encounter for screening mammogram for malignant neoplasm of breast: Secondary | ICD-10-CM | POA: Diagnosis not present

## 2021-10-20 DIAGNOSIS — Z1211 Encounter for screening for malignant neoplasm of colon: Secondary | ICD-10-CM

## 2021-10-20 LAB — HEMOGLOBIN A1C
Est. average glucose Bld gHb Est-mCnc: 111 mg/dL
Hgb A1c MFr Bld: 5.5 % (ref 4.8–5.6)

## 2021-10-20 LAB — CBC
Hematocrit: 49.5 % — ABNORMAL HIGH (ref 34.0–46.6)
Hemoglobin: 16.6 g/dL — ABNORMAL HIGH (ref 11.1–15.9)
MCH: 31.9 pg (ref 26.6–33.0)
MCHC: 33.5 g/dL (ref 31.5–35.7)
MCV: 95 fL (ref 79–97)
Platelets: 273 10*3/uL (ref 150–450)
RBC: 5.21 x10E6/uL (ref 3.77–5.28)
RDW: 12.8 % (ref 11.7–15.4)
WBC: 7.2 10*3/uL (ref 3.4–10.8)

## 2021-10-20 LAB — COMPREHENSIVE METABOLIC PANEL
ALT: 17 IU/L (ref 0–32)
AST: 17 IU/L (ref 0–40)
Albumin/Globulin Ratio: 1.9 (ref 1.2–2.2)
Albumin: 4.5 g/dL (ref 3.8–4.8)
Alkaline Phosphatase: 91 IU/L (ref 44–121)
BUN/Creatinine Ratio: 20 (ref 12–28)
BUN: 17 mg/dL (ref 8–27)
Bilirubin Total: 0.3 mg/dL (ref 0.0–1.2)
CO2: 25 mmol/L (ref 20–29)
Calcium: 9.9 mg/dL (ref 8.7–10.3)
Chloride: 102 mmol/L (ref 96–106)
Creatinine, Ser: 0.85 mg/dL (ref 0.57–1.00)
Globulin, Total: 2.4 g/dL (ref 1.5–4.5)
Glucose: 112 mg/dL — ABNORMAL HIGH (ref 70–99)
Potassium: 4.9 mmol/L (ref 3.5–5.2)
Sodium: 140 mmol/L (ref 134–144)
Total Protein: 6.9 g/dL (ref 6.0–8.5)
eGFR: 76 mL/min/{1.73_m2} (ref >59)

## 2021-10-20 LAB — LIPID PANEL
Chol/HDL Ratio: 4.2 ratio (ref 0.0–4.4)
Cholesterol, Total: 258 mg/dL — ABNORMAL HIGH (ref 100–199)
HDL: 61 mg/dL (ref >39)
LDL Chol Calc (NIH): 177 mg/dL — ABNORMAL HIGH (ref 0–99)
Triglycerides: 114 mg/dL (ref 0–149)
VLDL Cholesterol Cal: 20 mg/dL (ref 5–40)

## 2021-10-20 LAB — TSH+FREE T4
Free T4: 1.26 ng/dL (ref 0.82–1.77)
TSH: 0.901 u[IU]/mL (ref 0.450–4.500)

## 2021-10-20 MED ORDER — NA SULFATE-K SULFATE-MG SULF 17.5-3.13-1.6 GM/177ML PO SOLN: 1.0000 | Freq: Once | ORAL | 0 refills | Status: AC

## 2021-10-20 MED ORDER — ATORVASTATIN CALCIUM 20 MG PO TABS: 20.0000 mg | ORAL_TABLET | Freq: Every day | ORAL | 1 refills | Status: DC

## 2021-10-20 NOTE — Progress Notes (Signed)
Gastroenterology Pre-Procedure Review  Request Date: 12/24/2021 Requesting Physician: Dr. Marius Ditch  PATIENT REVIEW QUESTIONS: The patient responded to the following health history questions as indicated:    1. Are you having any GI issues? no 2. Do you have a personal history of Polyps? yes (03/2018- polyps removed) 3. Do you have a family history of Colon Cancer or Polyps? yes (Maternal grandmother; colon cancer; M. Uncle- colon cancer) 4. Diabetes Mellitus? no 5. Joint replacements in the past 12 months?no 6. Major health problems in the past 3 months?no 7. Any artificial heart valves, MVP, or defibrillator?no    MEDICATIONS & ALLERGIES:    Patient reports the following regarding taking any anticoagulation/antiplatelet therapy:   Plavix, Coumadin, Eliquis, Xarelto, Lovenox, Pradaxa, Brilinta, or Effient? no Aspirin? no  Patient confirms/reports the following medications:  Current Outpatient Medications  Medication Sig Dispense Refill   BIOTIN PO Take 1 tablet by mouth daily.      Calcium-Vitamin D-Vitamin K 500-100-40 MG-UNT-MCG CHEW Chew 1 tablet by mouth daily.      COLLAGEN PO Take 1 tablet by mouth daily.      escitalopram (LEXAPRO) 10 MG tablet Take 1 tablet (10 mg total) by mouth daily. 90 tablet 1   loratadine (CLARITIN) 10 MG tablet Take 10 mg by mouth daily as needed.      Multiple Vitamins-Minerals (MULTIVITAMIN GUMMIES ADULT) CHEW Chew 1 tablet by mouth daily.      No current facility-administered medications for this visit.    Patient confirms/reports the following allergies:  Allergies  Allergen Reactions   Chantix  [Varenicline] Itching   Penicillins     No orders of the defined types were placed in this encounter.   AUTHORIZATION INFORMATION Primary Insurance: 1D#: Group #:  Secondary Insurance: 1D#: Group #:  SCHEDULE INFORMATION: Date: 12/24/2021 Time: Location: [place]

## 2021-12-02 NOTE — Progress Notes (Signed)
? ?I,[name],acting as a scribe for [employer], PA-C.,have documented all relevant documentation on the behalf of [name], PA-C,as directed by  [name], PA-C while in the presence of [name], PA-C. ? ?Established Patient Office Visit ? ?Subjective:  ?Patient ID: Terri Weaver, female    DOB: 06/11/1956  Age: 66 y.o. MRN: [id number] ? ?CC: Terri Weaver presents for 6 week anxiety follow-up. ? ? ?HPI ? ?Anxiety, Follow-up ? ?She was last seen for anxiety 6 weeks ago. ?Changes made at last visit include restart lexapro 10. ?  ?She reports excellent compliance with treatment. ?She reports excellent tolerance of treatment. ?She is not having side effects.  ? ?She feels her anxiety is mild and Improved since last visit. "Feels reborn" ? ?Symptoms: ?No chest pain No difficulty concentrating  ?No dizziness No fatigue  ?No feelings of losing control No insomnia  ?No irritable No palpitations  ?No panic attacks No racing thoughts  ?No shortness of breath No sweating  ?No tremors/shakes   ? ?GAD-7 Results ? ?  12/03/2021  ?  8:24 AM 10/19/2021  ?  9:01 AM 05/05/2020  ?  8:09 AM  ?GAD-7 Generalized Anxiety Disorder Screening Tool  ?1. Feeling Nervous, Anxious, or on Edge 0 2 0  ?2. Not Being Able to Stop or Control Worrying 0 0 0  ?3. Worrying Too Much About Different Things 0 0 0  ?4. Trouble Relaxing 0 1 0  ?5. Being So Restless it's Hard To Sit Still 0 1 0  ?6. Becoming Easily Annoyed or Irritable 0 0 0  ?7. Feeling Afraid As If Something Awful Might Happen 0 1 0  ?Total GAD-7 Score 0 5 0  ?Difficulty At Work, Home, or Getting  Along With Others? Not difficult at all Somewhat difficult Not difficult at all  ? ? ?PHQ-9 Scores ? ?  12/03/2021  ?  8:23 AM 10/19/2021  ?  8:27 AM 05/05/2020  ?  8:10 AM  ?PHQ9 SCORE ONLY  ?PHQ-9 Total Score 0 0 0  ? ? ?--------------------------------------------------------------------------------------------------- ? ? ?History reviewed. No pertinent past medical  history. ? ?Past Surgical History:  ?Procedure Laterality Date  ? ABDOMINAL HYSTERECTOMY    ? COLONOSCOPY WITH PROPOFOL N/A 04/26/2018  ? Procedure: COLONOSCOPY WITH PROPOFOL;  Surgeon: Lin Landsman, MD;  Location: [facility] ENDOSCOPY;  Service: Gastroenterology;  Laterality: N/A;  ? ORIF ANKLE FRACTURE Right 07/31/2018  ? Procedure: OPEN REDUCTION INTERNAL FIXATION (ORIF) ANKLE FRACTURE;  Surgeon: Corky Mull, MD;  Location: [facility] ORS;  Service: Orthopedics;  Laterality: Right;  ? TONSILLECTOMY    ? ? ?Family History  ?Problem Relation Age of Onset  ? Stomach cancer Mother   ? Prostate cancer Father   ? COPD Father   ? Heart attack Sister   ? Breast cancer Maternal Aunt   ? ? ?Social History  ? ?Socioeconomic History  ? Marital status: Married  ?  Spouse name: Not on file  ? Number of children: Not on file  ? Years of education: Not on file  ? Highest education level: Not on file  ?Occupational History  ? Not on file  ?Tobacco Use  ? Smoking status: Every Day  ?  Packs/day: 0.25  ?  Types: Cigarettes  ? Smokeless tobacco: Never  ? Tobacco comments:  ?  Cut down from 1 pack daily to 1/2 pack a week  ?Vaping Use  ? Vaping Use: Never used  ?Substance and Sexual Activity  ? Alcohol use:  No  ? Drug use: No  ? Sexual activity: Yes  ?  Partners: Male  ?Other Topics Concern  ? Not on file  ?Social History Narrative  ? Not on file  ? ?Social Determinants of Health  ? ?Financial Resource Strain: Not on file  ?Food Insecurity: Not on file  ?Transportation Needs: Not on file  ?Physical Activity: Not on file  ?Stress: Not on file  ?Social Connections: Not on file  ?Intimate Partner Violence: Not on file  ? ? ?Outpatient Medications Prior to Visit  ?Medication Sig Dispense Refill  ? BIOTIN PO Take 1 tablet by mouth daily.     ? Calcium-Vitamin D-Vitamin K 500-100-40 MG-UNT-MCG CHEW Chew 1 tablet by mouth daily.     ? COLLAGEN PO Take 1 tablet by mouth daily.     ? escitalopram (LEXAPRO) 10 MG tablet Take 1 tablet (10 mg total)  by mouth daily. 90 tablet 1  ? KRILL OIL PO Take by mouth.    ? METAMUCIL FIBER PO Take by mouth.    ? Multiple Vitamins-Minerals (MULTIVITAMIN GUMMIES ADULT) CHEW Chew 1 tablet by mouth daily.     ? loratadine (CLARITIN) 10 MG tablet Take 10 mg by mouth daily as needed.     ? atorvastatin (LIPITOR) 20 MG tablet Take 1 tablet (20 mg total) by mouth daily. (Patient not taking: Reported on 12/03/2021) 90 tablet 1  ? ?No facility-administered medications prior to visit.  ? ? ?Allergies  ?Allergen Reactions  ? Chantix  [Varenicline] Itching  ? Penicillins   ? ? ?ROS ?Review of Systems  ?Constitutional:  Negative for fatigue and fever.  ?Respiratory:  Negative for cough and shortness of breath.   ?Cardiovascular:  Negative for chest pain and leg swelling.  ?Gastrointestinal:  Negative for abdominal pain.  ?Neurological:  Negative for dizziness and headaches.  ? ?  ?Objective:  ?  ?Physical Exam ?Constitutional:   ?   Appearance: Normal appearance. She is not ill-appearing.  ?HENT:  ?   Head: Normocephalic.  ?Eyes:  ?   Conjunctiva/sclera: Conjunctivae normal.  ?Cardiovascular:  ?   Rate and Rhythm: Normal rate and regular rhythm.  ?   Pulses: Normal pulses.  ?   Heart sounds: Normal heart sounds.  ?Pulmonary:  ?   Effort: Pulmonary effort is normal.  ?   Breath sounds: Normal breath sounds.  ?Musculoskeletal:  ?   Right lower leg: No edema.  ?   Left lower leg: No edema.  ?Neurological:  ?   Mental Status: She is oriented to person, place, and time.  ?Psychiatric:     ?   Mood and Affect: Mood normal.     ?   Behavior: Behavior normal.  ? ? ?BP 119/82 (BP Location: Right Arm, Patient Position: Sitting, Cuff Size: Normal)   Pulse 81   Ht _0  (1.6 m)   Wt 151 lb 6.4 oz (68.7 kg)   SpO2 96%   BMI 26.82 kg/m?  ?Wt Readings from Last 3 Encounters:  ?12/03/21 151 lb 6.4 oz (68.7 kg)  ?10/19/21 154 lb 14.4 oz (70.3 kg)  ?07/13/21 140 lb (63.5 kg)  ? ? ? ?Health Maintenance Due  ?Topic Date Due  ? COVID-19 Vaccine (4 -  Booster) 10/04/2020  ? ? ?There are no preventive care reminders to display for this patient. ? ?Lab Results  ?Component Value Date  ? TSH 0.901 10/19/2021  ? ?Lab Results  ?Component Value Date  ? WBC 7.2 10/19/2021  ?  HGB 16.6 (H) 10/19/2021  ? HCT 49.5 (H) 10/19/2021  ? MCV 95 10/19/2021  ? PLT 273 10/19/2021  ? ?Lab Results  ?Component Value Date  ? NA 140 10/19/2021  ? K 4.9 10/19/2021  ? CO2 25 10/19/2021  ? GLUCOSE 112 (H) 10/19/2021  ? BUN 17 10/19/2021  ? CREATININE 0.85 10/19/2021  ? BILITOT 0.3 10/19/2021  ? ALKPHOS 91 10/19/2021  ? AST 17 10/19/2021  ? ALT 17 10/19/2021  ? PROT 6.9 10/19/2021  ? ALBUMIN 4.5 10/19/2021  ? CALCIUM 9.9 10/19/2021  ? ANIONGAP 13 07/31/2018  ? EGFR 76 10/19/2021  ? ?Lab Results  ?Component Value Date  ? CHOL 258 (H) 10/19/2021  ? ?Lab Results  ?Component Value Date  ? HDL 61 10/19/2021  ? ?Lab Results  ?Component Value Date  ? LDLCALC 177 (H) 10/19/2021  ? ?Lab Results  ?Component Value Date  ? TRIG 114 10/19/2021  ? ?Lab Results  ?Component Value Date  ? CHOLHDL 4.2 10/19/2021  ? ?Lab Results  ?Component Value Date  ? HGBA1C 5.5 10/19/2021  ? ? ?  ?Assessment & Plan:  ? ?Problem List Items Addressed This Visit   ? ?  ? Other  ? Panic attack as reaction to stress - Primary  ?  Feels wonderful on lexapro 10 mg  ?She did reach out to therapy with work and can see them PRN ?Continue medications ?  ?  ? Encounter for tobacco use cessation counseling  ?  Pt has cut her use in half.  ?Congratulated and advised to continue w/ dec use ?  ?  ? Other hyperlipidemia  ?  Pt would like to try krill oil and metamucil first before statin ?The 10-year ASCVD risk score (Arnett DK, et al., 2019) is: 9% ?Advised we will recheck lipid panel in 2 months, but may still need statin despite holistic methods ?  ?  ? Relevant Orders  ? Lipid Profile  ? Comprehensive Metabolic Panel (CMET)  ?  ? ?Follow-up: Return in about 6 months (around 06/04/2022) for CPE.  ? ? ?I, [name], PA-C have reviewed  all documentation for this visit. The documentation on  12/03/2021 for the exam, diagnosis, procedures, and orders are all accurate and complete. ? ?[name], PA-C ?[facility]

## 2021-12-03 ENCOUNTER — Encounter: Payer: Self-pay | Admitting: Physician Assistant

## 2021-12-03 ENCOUNTER — Ambulatory Visit (INDEPENDENT_AMBULATORY_CARE_PROVIDER_SITE_OTHER): Payer: BC Managed Care – PPO | Admitting: Physician Assistant

## 2021-12-03 VITALS — BP 119/82 | HR 81 | Ht 63.0 in | Wt 151.4 lb

## 2021-12-03 DIAGNOSIS — F430 Acute stress reaction: Secondary | ICD-10-CM | POA: Diagnosis not present

## 2021-12-03 DIAGNOSIS — Z716 Tobacco abuse counseling: Secondary | ICD-10-CM | POA: Insufficient documentation

## 2021-12-03 DIAGNOSIS — F410 Panic disorder [episodic paroxysmal anxiety] without agoraphobia: Secondary | ICD-10-CM

## 2021-12-03 DIAGNOSIS — E7849 Other hyperlipidemia: Secondary | ICD-10-CM | POA: Diagnosis not present

## 2021-12-03 NOTE — Assessment & Plan Note (Signed)
Pt would like to try krill oil and metamucil first before statin ?The 10-year ASCVD risk score (Arnett DK, et al., 2019) is: 9% ?Advised we will recheck lipid panel in 2 months, but may still need statin despite holistic methods ?

## 2021-12-03 NOTE — Assessment & Plan Note (Signed)
Pt has cut her use in half.  ?Congratulated and advised to continue w/ dec use ?

## 2021-12-03 NOTE — Assessment & Plan Note (Signed)
Feels wonderful on lexapro 10 mg  ?She did reach out to therapy with work and can see them PRN ?Continue medications ?

## 2021-12-23 ENCOUNTER — Encounter: Payer: Self-pay | Admitting: Gastroenterology

## 2021-12-24 ENCOUNTER — Ambulatory Visit: Payer: BC Managed Care – PPO | Admitting: Anesthesiology

## 2021-12-24 ENCOUNTER — Encounter
Admission: RE | Disposition: A | Discharge status: Home / Self Care | Payer: Self-pay | Source: Home / Self Care | Attending: Gastroenterology

## 2021-12-24 ENCOUNTER — Ambulatory Visit
Admission: RE | Admit: 2021-12-24 | Discharge: 2021-12-24 | Disposition: A | Discharge status: Home / Self Care | Payer: BC Managed Care – PPO | Attending: Gastroenterology | Admitting: Gastroenterology

## 2021-12-24 DIAGNOSIS — Z86010 Personal history of colonic polyps: Secondary | ICD-10-CM | POA: Insufficient documentation

## 2021-12-24 DIAGNOSIS — Z1211 Encounter for screening for malignant neoplasm of colon: Secondary | ICD-10-CM | POA: Diagnosis present

## 2021-12-24 DIAGNOSIS — D125 Benign neoplasm of sigmoid colon: Secondary | ICD-10-CM | POA: Diagnosis not present

## 2021-12-24 DIAGNOSIS — K635 Polyp of colon: Secondary | ICD-10-CM

## 2021-12-24 DIAGNOSIS — D120 Benign neoplasm of cecum: Secondary | ICD-10-CM | POA: Diagnosis not present

## 2021-12-24 DIAGNOSIS — F17210 Nicotine dependence, cigarettes, uncomplicated: Secondary | ICD-10-CM | POA: Diagnosis not present

## 2021-12-24 HISTORY — PX: COLONOSCOPY WITH PROPOFOL: SHX5780

## 2021-12-24 SURGERY — COLONOSCOPY WITH PROPOFOL
Anesthesia: General

## 2021-12-24 MED ORDER — SODIUM CHLORIDE 0.9 % IV SOLN
INTRAVENOUS | Status: DC
  Administered 2021-12-24: 20 mL/h via INTRAVENOUS

## 2021-12-24 MED ORDER — PROPOFOL 10 MG/ML IV BOLUS
INTRAVENOUS | Status: AC
  Filled 2021-12-24: qty 20

## 2021-12-24 MED ORDER — LIDOCAINE HCL (CARDIAC) PF 100 MG/5ML IV SOSY
PREFILLED_SYRINGE | INTRAVENOUS | Status: DC | PRN
  Administered 2021-12-24: 100 mg via INTRAVENOUS

## 2021-12-24 MED ORDER — LIDOCAINE HCL (PF) 2 % IJ SOLN
INTRAMUSCULAR | Status: AC
Start: 2021-12-24 — End: ?
  Filled 2021-12-24: qty 5

## 2021-12-24 MED ORDER — PROPOFOL 10 MG/ML IV BOLUS
INTRAVENOUS | Status: DC | PRN
  Administered 2021-12-24: 100 ug/kg/min via INTRAVENOUS
  Administered 2021-12-24: 100 mg via INTRAVENOUS

## 2021-12-24 NOTE — Anesthesia Preprocedure Evaluation (Addendum)
Anesthesia Evaluation  ?Patient identified by MRN, date of birth, ID band ?Patient awake ? ? ? ?Reviewed: ?Allergy & Precautions, NPO status , Patient's Chart, lab work & pertinent test results ? ?History of Anesthesia Complications ?Negative for: history of anesthetic complications ? ?Airway ?Mallampati: II ? ?TM Distance: >3 FB ?Neck ROM: Full ? ? ? Dental ? ?(+) Implants ?  ?Pulmonary ?neg sleep apnea, neg COPD, Current Smoker and Patient abstained from smoking.,  ?  ?breath sounds clear to auscultation- rhonchi ?(-) wheezing ? ? ? ? ? Cardiovascular ?Exercise Tolerance: Good ?(-) hypertension(-) CAD, (-) Past MI, (-) Cardiac Stents and (-) CABG  ?Rhythm:Regular Rate:Normal ?- Systolic murmurs and - Diastolic murmurs ? ?  ?Neuro/Psych ?PSYCHIATRIC DISORDERS Anxiety negative neurological ROS ?   ? GI/Hepatic ?negative GI ROS, Neg liver ROS,   ?Endo/Other  ?negative endocrine ROSneg diabetes ? Renal/GU ?negative Renal ROS  ? ?  ?Musculoskeletal ?negative musculoskeletal ROS ?(+)  ? Abdominal ?Normal abdominal exam  (+) - obese,   ?Peds ? Hematology ?negative hematology ROS ?(+)   ?Anesthesia Other Findings ? ? ? Reproductive/Obstetrics ? ?  ? ? ? ? ? ? ? ? ? ? ? ? ? ?  ?  ? ? ? ? ? ? ? ?Anesthesia Physical ? ?Anesthesia Plan ? ?ASA: II ? ?Anesthesia Plan: General  ? ?Post-op Pain Management:   ? ?Induction: Intravenous ? ?PONV Risk Score and Plan: 1 and Treatment may vary due to age or medical condition ? ?Airway Management Planned: Natural Airway ? ?Additional Equipment:  ? ?Intra-op Plan:  ? ?Post-operative Plan:  ? ?Informed Consent: I have reviewed the patients History and Physical, chart, labs and discussed the procedure including the risks, benefits and alternatives for the proposed anesthesia with the patient or authorized representative who has indicated his/her understanding and acceptance.  ? ? ? ?Dental advisory given ? ?Plan Discussed with: CRNA and  Anesthesiologist ? ?Anesthesia Plan Comments:   ? ? ? ? ? ?Anesthesia Quick Evaluation ? ?

## 2021-12-24 NOTE — H&P (Signed)
?Cephas Darby, MD ?[address]  ?[address]  ?[place], [place] [address]  ?Main: [phone]  ?Fax: [phone] ?Pager: [phone] ? ?Primary Care Physician:  [name], PA-C ?Primary Gastroenterologist:  Dr. Cephas Darby ? ?Pre-Procedure History & Physical: ?HPI:  Terri Weaver is a 66 y.o. female is here for an colonoscopy. ?  ?History reviewed. No pertinent past medical history. ? ?Past Surgical History:  ?Procedure Laterality Date  ? ABDOMINAL HYSTERECTOMY    ? COLONOSCOPY WITH PROPOFOL N/A 04/26/2018  ? Procedure: COLONOSCOPY WITH PROPOFOL;  Surgeon: Lin Landsman, MD;  Location: [facility] ENDOSCOPY;  Service: Gastroenterology;  Laterality: N/A;  ? ORIF ANKLE FRACTURE Right 07/31/2018  ? Procedure: OPEN REDUCTION INTERNAL FIXATION (ORIF) ANKLE FRACTURE;  Surgeon: Corky Mull, MD;  Location: [facility] ORS;  Service: Orthopedics;  Laterality: Right;  ? TONSILLECTOMY    ? ? ?Prior to Admission medications   ?Medication Sig Start Date End Date Taking? Authorizing Provider  ?BIOTIN PO Take 1 tablet by mouth daily.    Yes [provider]  ?Calcium-Vitamin D-Vitamin K 500-100-40 MG-UNT-MCG CHEW Chew 1 tablet by mouth daily.    Yes [provider]  ?COLLAGEN PO Take 1 tablet by mouth daily.    Yes [provider]  ?escitalopram (LEXAPRO) 10 MG tablet Take 1 tablet (10 mg total) by mouth daily. 10/19/21  Yes Drubel, Ria Comment, PA-C  ?KRILL OIL PO Take by mouth.   Yes [provider]  ?loratadine (CLARITIN) 10 MG tablet Take 10 mg by mouth daily as needed.    Yes [provider]  ?METAMUCIL FIBER PO Take by mouth.   Yes [provider]  ?Multiple Vitamins-Minerals (MULTIVITAMIN GUMMIES ADULT) CHEW Chew 1 tablet by mouth daily.    Yes [provider]  ? ? ?Allergies as of 10/20/2021 - Review Complete 10/19/2021  ?Allergen Reaction Noted  ? Chantix  [varenicline] Itching   ? Penicillins    ? ? ?Family History  ?Problem Relation Age of Onset  ?  Stomach cancer Mother   ? Prostate cancer Father   ? COPD Father   ? Heart attack Sister   ? Breast cancer Maternal Aunt   ? ? ?Social History  ? ?Socioeconomic History  ? Marital status: Married  ?  Spouse name: Not on file  ? Number of children: Not on file  ? Years of education: Not on file  ? Highest education level: Not on file  ?Occupational History  ? Not on file  ?Tobacco Use  ? Smoking status: Every Day  ?  Packs/day: 0.25  ?  Types: Cigarettes  ? Smokeless tobacco: Never  ? Tobacco comments:  ?  Cut down from 1 pack daily to 1/2 pack a week  ?Vaping Use  ? Vaping Use: Never used  ?Substance and Sexual Activity  ? Alcohol use: No  ? Drug use: No  ? Sexual activity: Yes  ?  Partners: Male  ?Other Topics Concern  ? Not on file  ?Social History Narrative  ? Not on file  ? ?Social Determinants of Health  ? ?Financial Resource Strain: Not on file  ?Food Insecurity: Not on file  ?Transportation Needs: Not on file  ?Physical Activity: Not on file  ?Stress: Not on file  ?Social Connections: Not on file  ?Intimate Partner Violence: Not on file  ? ? ?Review of Systems: ?See HPI, otherwise negative ROS ? ?Physical Exam: ?BP 128/83   Pulse (!) 105   Temp (!) 97.5 ?F (  36.4 ?C) (Temporal)   Resp 20   Ht 5' 3.5" (1.613 m)   Wt 66.7 kg   SpO2 100%   BMI 25.63 kg/m?  ?General:   Alert,  pleasant and cooperative in NAD ?Head:  Normocephalic and atraumatic. ?Neck:  Supple; no masses or thyromegaly. ?Lungs:  Clear throughout to auscultation.    ?Heart:  Regular rate and rhythm. ?Abdomen:  Soft, nontender and nondistended. Normal bowel sounds, without guarding, and without rebound.   ?Neurologic:  Alert and  oriented x4;  grossly normal neurologically. ? ?Impression/Plan: ?Terri Weaver is here for an colonoscopy to be performed for h/o colon adenomas ? ?Risks, benefits, limitations, and alternatives regarding  colonoscopy have been reviewed with the patient.  Questions have been answered.  All parties  agreeable. ? ? ?Sherri Sear, MD  12/24/2021, 7:50 AM ?

## 2021-12-24 NOTE — Op Note (Signed)
[facility] ?Gastroenterology ?Patient Name: Terri Weaver ?Procedure Date: 12/24/2021 6:58 AM ?MRN: [id number] ?Account #: [id number] ?Date of Birth: 11/14/1955 ?Admit Type: Outpatient ?Age: 66 ?Room: [facility] ENDO ROOM 4 ?Gender: Female ?Note Status: Finalized ?Instrument Name: Colonoscope [id number] ?Procedure:             Colonoscopy ?Indications:           Surveillance: Personal history of adenomatous polyps  ?                       on last colonoscopy > 3 years ago, Last colonoscopy:  ?                       August 2019 ?Providers:             Lin Landsman MD, MD ?Referring MD:          [name] PA-C ?Medicines:             General Anesthesia ?Complications:         No immediate complications. Estimated blood loss: None. ?Procedure:             Pre-Anesthesia Assessment: ?                       - Prior to the procedure, a History and Physical was  ?                       performed, and patient medications and allergies were  ?                       reviewed. The patient is competent. The risks and  ?                       benefits of the procedure and the sedation options and  ?                       risks were discussed with the patient. All questions  ?                       were answered and informed consent was obtained.  ?                       Patient identification and proposed procedure were  ?                       verified by the physician, the nurse, the  ?                       anesthesiologist, the anesthetist and the technician  ?                       in the pre-procedure area in the procedure room in the  ?                       endoscopy suite. Mental Status Examination: alert and  ?                       oriented. Airway Examination: normal oropharyngeal  ?  airway and neck mobility. Respiratory Examination:  ?                       clear to auscultation. CV Examination: normal.  ?                       Prophylactic Antibiotics: The patient does  not require  ?                       prophylactic antibiotics. Prior Anticoagulants: The  ?                       patient has taken no previous anticoagulant or  ?                       antiplatelet agents. ASA Grade Assessment: II - A  ?                       patient with mild systemic disease. After reviewing  ?                       the risks and benefits, the patient was deemed in  ?                       satisfactory condition to undergo the procedure. The  ?                       anesthesia plan was to use general anesthesia.  ?                       Immediately prior to administration of medications,  ?                       the patient was re-assessed for adequacy to receive  ?                       sedatives. The heart rate, respiratory rate, oxygen  ?                       saturations, blood pressure, adequacy of pulmonary  ?                       ventilation, and response to care were monitored  ?                       throughout the procedure. The physical status of the  ?                       patient was re-assessed after the procedure. ?                       After obtaining informed consent, the colonoscope was  ?                       passed under direct vision. Throughout the procedure,  ?                       the patient's blood pressure, pulse, and oxygen  ?  saturations were monitored continuously. The  ?                       Colonoscope was introduced through the anus and  ?                       advanced to the the terminal ileum, with  ?                       identification of the appendiceal orifice and IC  ?                       valve. The colonoscopy was performed without  ?                       difficulty. The patient tolerated the procedure well.  ?                       The quality of the bowel preparation was evaluated  ?                       using the BBPS [facility] Bowel Preparation Scale) with  ?                       scores of: Right Colon = 3, Transverse  Colon = 3 and  ?                       Left Colon = 3 (entire mucosa seen well with no  ?                       residual staining, small fragments of stool or opaque  ?                       liquid). The total BBPS score equals 9. ?Findings: ?     The perianal and digital rectal examinations were normal. Pertinent  ?     negatives include normal sphincter tone and no palpable rectal lesions. ?     The terminal ileum appeared normal. ?     Two sessile polyps were found in the sigmoid colon and cecum. The polyps  ?     were diminutive in size. These polyps were removed with a cold biopsy  ?     forceps. Resection and retrieval were complete. ?     A 5 mm polyp was found in the sigmoid colon. The polyp was sessile. The  ?     polyp was removed with a cold snare. Resection and retrieval were  ?     complete. Estimated blood loss: none. ?     The retroflexed view of the distal rectum and anal verge was normal and  ?     showed no anal or rectal abnormalities. ?Impression:            - The examined portion of the ileum was normal. ?                       - Two diminutive polyps in the sigmoid colon and in  ?  the cecum, removed with a cold biopsy forceps.  ?                       Resected and retrieved. ?                       - One 5 mm polyp in the sigmoid colon, removed with a  ?                       cold snare. Resected and retrieved. ?                       - The distal rectum and anal verge are normal on  ?                       retroflexion view. ?Recommendation:        - Discharge patient to home (with escort). ?                       - Resume previous diet today. ?                       - Continue present medications. ?                       - Await pathology results. ?                       - Repeat colonoscopy in 5 years for surveillance. ?Procedure Code(s):     --- Professional --- ?                       [phone], Colonoscopy, flexible; with removal of  ?                       tumor(s),  polyp(s), or other lesion(s) by snare  ?                       technique ?                       45380, 59, Colonoscopy, flexible; with biopsy, single  ?                       or multiple ?Diagnosis Code(s):     --- Professional --- ?                       K63.5, Polyp of colon ?                       Z86.010, Personal history of colonic polyps ?CPT copyright 2019 American Medical Association. All rights reserved. ?The codes documented in this report are preliminary and upon coder review may  ?be revised to meet current compliance requirements. ?Dr. Ulyess Mort ?Lott Seelbach Raeanne Gathers MD, MD ?12/24/2021 8:16:15 AM ?This report has been signed electronically. ?Number of Addenda: 0 ?Note Initiated On: 12/24/2021 6:58 AM ?Scope Withdrawal Time: 0 hours 10 minutes 42 seconds  ?Total Procedure Duration: 0 hours 14 minutes 20 seconds  ?Estimated Blood Loss:  Estimated blood loss: none. ?     [facility] ?

## 2021-12-24 NOTE — Transfer of Care (Signed)
Immediate Anesthesia Transfer of Care Note ? ?Patient: Terri Weaver ? ?Procedure(s) Performed: COLONOSCOPY WITH PROPOFOL ? ?Patient Location: PACU ? ?Anesthesia Type:General ? ?Level of Consciousness: awake ? ?Airway & Oxygen Therapy: Patient Spontanous Breathing ? ?Post-op Assessment: Report given to RN and Post -op Vital signs reviewed and stable ? ?Post vital signs: Reviewed and stable ? ?Last Vitals:  ?Vitals Value Taken Time  ?BP 91/50 12/24/21 0819  ?Temp 36   ?Pulse 84 12/24/21 0819  ?Resp 23 12/24/21 0819  ?SpO2 100 % 12/24/21 0819  ?Vitals shown include unvalidated device data. ? ?Last Pain:  ?Vitals:  ? 12/24/21 0818  ?TempSrc:   ?PainSc: 0-No pain  ?   ? ?  ? ?Complications: No notable events documented. ?

## 2021-12-24 NOTE — Anesthesia Postprocedure Evaluation (Signed)
Anesthesia Post Note ? ?Patient: Terri Weaver ? ?Procedure(s) Performed: COLONOSCOPY WITH PROPOFOL ? ?Patient location during evaluation: Endoscopy ?Anesthesia Type: General ?Level of consciousness: awake and alert ?Pain management: pain level controlled ?Vital Signs Assessment: post-procedure vital signs reviewed and stable ?Respiratory status: spontaneous breathing, nonlabored ventilation and respiratory function stable ?Cardiovascular status: blood pressure returned to baseline and stable ?Postop Assessment: no apparent nausea or vomiting ?Anesthetic complications: no ? ? ?No notable events documented. ? ? ?Last Vitals:  ?Vitals:  ? 12/24/21 0829 12/24/21 0839  ?BP:  120/77  ?Pulse: 78   ?Resp:    ?Temp:    ?SpO2:    ?  ?Last Pain:  ?Vitals:  ? 12/24/21 0839  ?TempSrc:   ?PainSc: 0-No pain  ? ? ?  ?  ?  ?  ?  ?  ? ?[place] Ouch ? ? ? ? ?

## 2021-12-27 ENCOUNTER — Encounter: Payer: Self-pay | Admitting: Gastroenterology

## 2021-12-27 LAB — SURGICAL PATHOLOGY

## 2022-04-17 ENCOUNTER — Other Ambulatory Visit: Payer: Self-pay | Admitting: Physician Assistant

## 2022-04-17 DIAGNOSIS — F410 Panic disorder [episodic paroxysmal anxiety] without agoraphobia: Secondary | ICD-10-CM

## 2022-05-08 ENCOUNTER — Other Ambulatory Visit: Payer: Self-pay | Admitting: Physician Assistant

## 2022-06-03 NOTE — Progress Notes (Signed)
[place],acting as a scribe for Terri Kirschner, PA-C.,have documented all relevant documentation on the behalf of Terri Kirschner, PA-C,as directed by  Terri Kirschner, PA-C while in the presence of Terri Kirschner, PA-C.    Complete physical exam   Patient: Terri Weaver   DOB: [birth year]   66 y.o. Female  MRN: [id number] Visit Date: 06/06/2022  Today's healthcare provider: [name], PA-C   Cc. Cpe   Subjective    Terri Weaver is a 66 y.o. female who presents today for a complete physical exam.   History reviewed. No pertinent past medical history. Past Surgical History:  Procedure Laterality Date   ABDOMINAL HYSTERECTOMY     COLONOSCOPY WITH PROPOFOL N/A 04/26/2018   Procedure: COLONOSCOPY WITH PROPOFOL;  Surgeon: Lin Landsman, MD;  Location: [facility] ENDOSCOPY;  Service: Gastroenterology;  Laterality: N/A;   COLONOSCOPY WITH PROPOFOL N/A 12/24/2021   Procedure: COLONOSCOPY WITH PROPOFOL;  Surgeon: Lin Landsman, MD;  Location: [facility] ENDOSCOPY;  Service: Gastroenterology;  Laterality: N/A;   ORIF ANKLE FRACTURE Right 07/31/2018   Procedure: OPEN REDUCTION INTERNAL FIXATION (ORIF) ANKLE FRACTURE;  Surgeon: Corky Mull, MD;  Location: [facility] ORS;  Service: Orthopedics;  Laterality: Right;   TONSILLECTOMY     Social History   Socioeconomic History   Marital status: Married    Spouse name: Not on file   Number of children: Not on file   Years of education: Not on file   Highest education level: Not on file  Occupational History   Not on file  Tobacco Use   Smoking status: Every Day    Packs/day: 0.25    Years: 30.00    Total pack years: 7.50    Types: Cigarettes   Smokeless tobacco: Never   Tobacco comments:    Cut down from 1 pack daily to 1/2 pack a week  Vaping Use   Vaping Use: Never used  Substance and Sexual Activity   Alcohol use: No   Drug use: No   Sexual activity: Yes    Partners: Male  Other Topics Concern   Not on file   Social History Narrative   Not on file   Social Determinants of Health   Financial Resource Strain: Not on file  Food Insecurity: Not on file  Transportation Needs: Not on file  Physical Activity: Not on file  Stress: Not on file  Social Connections: Not on file  Intimate Partner Violence: Not on file   Family Status  Relation Name Status   Mother  Deceased   Father  Deceased   Sister  Alive   Mat Aunt  (Not Specified)   Family History  Problem Relation Age of Onset   Stomach cancer Mother    Prostate cancer Father    COPD Father    Heart attack Sister    Breast cancer Maternal Aunt    Allergies  Allergen Reactions   Chantix  [Varenicline] Itching   Penicillins     Patient Care Team: Terri Kirschner, PA-C as PCP - General (Physician Assistant)   Medications: Outpatient Medications Prior to Visit  Medication Sig   BIOTIN PO Take 1 tablet by mouth daily.    Calcium-Vitamin D-Vitamin K 500-100-40 MG-UNT-MCG CHEW Chew 1 tablet by mouth daily.    COLLAGEN PO Take 1 tablet by mouth daily.    escitalopram (LEXAPRO) 10 MG tablet TAKE 1 TABLET BY MOUTH EVERY DAY   KRILL OIL PO Take by mouth.   METAMUCIL FIBER PO  Take by mouth.   Multiple Vitamins-Minerals (MULTIVITAMIN GUMMIES ADULT) CHEW Chew 1 tablet by mouth daily.    atorvastatin (LIPITOR) 20 MG tablet Take 20 mg by mouth daily.   loratadine (CLARITIN) 10 MG tablet Take 10 mg by mouth daily as needed.    No facility-administered medications prior to visit.    Review of Systems  Constitutional: Negative.   HENT:  Positive for tinnitus.   Eyes: Negative.   Respiratory: Negative.    Cardiovascular: Negative.   Gastrointestinal: Negative.   Endocrine: Negative.   Genitourinary: Negative.   Musculoskeletal:  Positive for neck stiffness.  Skin: Negative.   Allergic/Immunologic: Negative.   Neurological: Negative.   Hematological: Negative.   Psychiatric/Behavioral: Negative.      Objective    BP (!) 141/87  (BP Location: Left Arm, Cuff Size: Normal)   Pulse 93   Temp 98.4 F (36.9 C) (Oral)   Wt 146 lb (66.2 kg)   SpO2 98%   BMI 25.46 kg/m    Physical Exam Constitutional:      General: She is awake.     Appearance: She is well-developed. She is not ill-appearing.  HENT:     Head: Normocephalic.     Right Ear: Tympanic membrane normal.     Left Ear: Tympanic membrane normal.     Nose: Nose normal. No congestion or rhinorrhea.     Mouth/Throat:     Pharynx: No oropharyngeal exudate or posterior oropharyngeal erythema.  Eyes:     Conjunctiva/sclera: Conjunctivae normal.     Pupils: Pupils are equal, round, and reactive to light.  Neck:     Thyroid: No thyroid mass or thyromegaly.  Cardiovascular:     Rate and Rhythm: Normal rate and regular rhythm.     Heart sounds: Normal heart sounds.  Pulmonary:     Effort: Pulmonary effort is normal.     Breath sounds: Normal breath sounds.  Abdominal:     Palpations: Abdomen is soft.     Tenderness: There is no abdominal tenderness.  Musculoskeletal:     Right lower leg: No swelling. No edema.     Left lower leg: No swelling. No edema.  Lymphadenopathy:     Cervical: No cervical adenopathy.  Skin:    General: Skin is warm.  Neurological:     Mental Status: She is alert and oriented to person, place, and time.  Psychiatric:        Attention and Perception: Attention normal.        Mood and Affect: Mood normal.        Speech: Speech normal.        Behavior: Behavior normal. Behavior is cooperative.      Last depression screening scores    06/06/2022    8:58 AM 12/03/2021    8:23 AM 10/19/2021    8:27 AM  PHQ 2/9 Scores  PHQ - 2 Score 0 0 0  PHQ- 9 Score 1 0 0   Last fall risk screening    06/06/2022    8:57 AM  [place] in the past year? 0  Number falls in past yr: 0  Injury with Fall? 0   Last Audit-C alcohol use screening    06/06/2022    8:57 AM  Alcohol Use Disorder Test (AUDIT)  1. How often do you have  a drink containing alcohol? 1  2. How many drinks containing alcohol do you have on a typical day when you are drinking? 0  3. How often do you have six or more drinks on one occasion? 0  AUDIT-C Score 1   A score of 3 or more in women, and 4 or more in men indicates increased risk for alcohol abuse, EXCEPT if all of the points are from question 1   No results found for any visits on 06/06/22.  Assessment & Plan    Routine Health Maintenance and Physical Exam  Exercise Activities and Dietary recommendations --balanced diet high in fiber and protein, low in sugars, carbs, fats. --physical activity/exercise 30 minutes 3-5 times a week    Immunization History  Administered Date(s) Administered   Influenza, Quadrivalent, Recombinant, Inj, Pf 07/29/2021   Influenza-Unspecified 06/05/2019, 06/03/2022   Moderna Sars-Covid-2 Vaccination 08/09/2020   PFIZER(Purple Top)SARS-COV-2 Vaccination 12/03/2019, 12/24/2019   Tdap 12/06/2006, 02/06/2020    Health Maintenance  Topic Date Due   Zoster Vaccines- Shingrix (1 of 2) Never done   COVID-19 Vaccine (4 - Mixed Product risk series) 10/04/2020   MAMMOGRAM  10/20/2022   COLONOSCOPY (Pts 45-67yr Insurance coverage will need to be confirmed)  12/25/2026   TETANUS/TDAP  02/05/2030   INFLUENZA VACCINE  Completed   Hepatitis C Screening  Completed   HIV Screening  Completed   HPV VACCINES  Aged Out    Discussed health benefits of physical activity, and encouraged her to engage in regular exercise appropriate for her age and condition.  Problem List Items Addressed This Visit       Other   Panic attack as reaction to stress    Stable on lexapro 10 mg. Recent stress as pt lost her job        Other hyperlipidemia    Pt started atorvastatin 20 mg 6/23.  Will repeat lipid panel today, goal LDL < 70.  The 10-year ASCVD risk score (Arnett DK, et al., 2019) is: 12.3%       Relevant Medications   atorvastatin (LIPITOR) 20 MG tablet    Other Relevant Orders   Comprehensive metabolic panel   Lipid Panel With LDL/HDL Ratio   Macular degeneration, dry    Recent dx. Pt is being followed by optho. No vision impairment.       Elevated hemoglobin (HCC)    Likely 2/2 tobacco use      Relevant Orders   CBC with Differential/Platelet   Tobacco use    Pack year is about ~15  Discussed LDCT screening she is eligible at ~20 pack years, advised if she increases the amount she is smoking/continues to smoke she may become eligible.        Elevated blood pressure reading in office without diagnosis of hypertension    Elevated in office typically within range Pt was discussing recent life stress- was let go from job Advised we will monitor, pt can also check at home      Other Visit Diagnoses     Annual physical exam    -  Primary   Relevant Orders   TSH   Hyperglycemia       Relevant Orders   HgB A1c        Return in about 6 months (around 12/06/2022) for anxiety.     I, LMikey Kirschner PA-C have reviewed all documentation for this visit. The documentation on  06/06/2022 for the exam, diagnosis, procedures, and orders are all accurate and complete.  LMikey Kirschner PA-C [facility] [place] [address]Office: [phone]: [phone]

## 2022-06-06 ENCOUNTER — Encounter: Payer: Self-pay | Admitting: Physician Assistant

## 2022-06-06 ENCOUNTER — Ambulatory Visit (INDEPENDENT_AMBULATORY_CARE_PROVIDER_SITE_OTHER): Payer: BC Managed Care – PPO | Admitting: Physician Assistant

## 2022-06-06 VITALS — BP 141/87 | HR 93 | Temp 98.4°F | Wt 146.0 lb

## 2022-06-06 DIAGNOSIS — R030 Elevated blood-pressure reading, without diagnosis of hypertension: Secondary | ICD-10-CM

## 2022-06-06 DIAGNOSIS — E7849 Other hyperlipidemia: Secondary | ICD-10-CM

## 2022-06-06 DIAGNOSIS — H353190 Nonexudative age-related macular degeneration, unspecified eye, stage unspecified: Secondary | ICD-10-CM

## 2022-06-06 DIAGNOSIS — D582 Other hemoglobinopathies: Secondary | ICD-10-CM | POA: Diagnosis not present

## 2022-06-06 DIAGNOSIS — F410 Panic disorder [episodic paroxysmal anxiety] without agoraphobia: Secondary | ICD-10-CM | POA: Diagnosis not present

## 2022-06-06 DIAGNOSIS — Z0000 Encounter for general adult medical examination without abnormal findings: Secondary | ICD-10-CM

## 2022-06-06 DIAGNOSIS — F430 Acute stress reaction: Secondary | ICD-10-CM

## 2022-06-06 DIAGNOSIS — Z23 Encounter for immunization: Secondary | ICD-10-CM

## 2022-06-06 DIAGNOSIS — F17201 Nicotine dependence, unspecified, in remission: Secondary | ICD-10-CM | POA: Insufficient documentation

## 2022-06-06 DIAGNOSIS — Z720 Tobacco use: Secondary | ICD-10-CM | POA: Insufficient documentation

## 2022-06-06 DIAGNOSIS — R739 Hyperglycemia, unspecified: Secondary | ICD-10-CM

## 2022-06-06 NOTE — Assessment & Plan Note (Signed)
Recent dx. Pt is being followed by optho. No vision impairment.

## 2022-06-06 NOTE — Assessment & Plan Note (Signed)
Pt started atorvastatin 20 mg 6/23.  Will repeat lipid panel today, goal LDL < 70.  The 10-year ASCVD risk score (Arnett DK, et al., 2019) is: 12.3%

## 2022-06-06 NOTE — Assessment & Plan Note (Signed)
Likely 2/2 tobacco use

## 2022-06-06 NOTE — Assessment & Plan Note (Signed)
Elevated in office typically within range Pt was discussing recent life stress- was let go from job Advised we will monitor, pt can also check at home

## 2022-06-06 NOTE — Assessment & Plan Note (Signed)
Stable on lexapro 10 mg. Recent stress as pt lost her job

## 2022-06-06 NOTE — Assessment & Plan Note (Signed)
Pack year is about ~15  Discussed LDCT screening she is eligible at ~20 pack years, advised if she increases the amount she is smoking/continues to smoke she may become eligible.

## 2022-06-07 LAB — CBC WITH DIFFERENTIAL/PLATELET
Basophils Absolute: 0.1 10*3/uL (ref 0.0–0.2)
Basos: 1 %
EOS (ABSOLUTE): 0.2 10*3/uL (ref 0.0–0.4)
Eos: 3 %
Hematocrit: 45.4 % (ref 34.0–46.6)
Hemoglobin: 15.3 g/dL (ref 11.1–15.9)
Immature Grans (Abs): 0 10*3/uL (ref 0.0–0.1)
Immature Granulocytes: 0 %
Lymphocytes Absolute: 1.3 10*3/uL (ref 0.7–3.1)
Lymphs: 18 %
MCH: 32.8 pg (ref 26.6–33.0)
MCHC: 33.7 g/dL (ref 31.5–35.7)
MCV: 97 fL (ref 79–97)
Monocytes Absolute: 0.7 10*3/uL (ref 0.1–0.9)
Monocytes: 10 %
Neutrophils Absolute: 4.9 10*3/uL (ref 1.4–7.0)
Neutrophils: 68 %
Platelets: 268 10*3/uL (ref 150–450)
RBC: 4.66 x10E6/uL (ref 3.77–5.28)
RDW: 12.4 % (ref 11.7–15.4)
WBC: 7.3 10*3/uL (ref 3.4–10.8)

## 2022-06-07 LAB — COMPREHENSIVE METABOLIC PANEL
ALT: 19 IU/L (ref 0–32)
AST: 21 IU/L (ref 0–40)
Albumin/Globulin Ratio: 2.1 (ref 1.2–2.2)
Albumin: 4.6 g/dL (ref 3.9–4.9)
Alkaline Phosphatase: 91 IU/L (ref 44–121)
BUN/Creatinine Ratio: 15 (ref 12–28)
BUN: 12 mg/dL (ref 8–27)
Bilirubin Total: 0.4 mg/dL (ref 0.0–1.2)
CO2: 23 mmol/L (ref 20–29)
Calcium: 9.5 mg/dL (ref 8.7–10.3)
Chloride: 105 mmol/L (ref 96–106)
Creatinine, Ser: 0.81 mg/dL (ref 0.57–1.00)
Globulin, Total: 2.2 g/dL (ref 1.5–4.5)
Glucose: 124 mg/dL — ABNORMAL HIGH (ref 70–99)
Potassium: 4.7 mmol/L (ref 3.5–5.2)
Sodium: 146 mmol/L — ABNORMAL HIGH (ref 134–144)
Total Protein: 6.8 g/dL (ref 6.0–8.5)
eGFR: 81 mL/min/{1.73_m2} (ref >59)

## 2022-06-07 LAB — HEMOGLOBIN A1C
Est. average glucose Bld gHb Est-mCnc: 120 mg/dL
Hgb A1c MFr Bld: 5.8 % — ABNORMAL HIGH (ref 4.8–5.6)

## 2022-06-07 LAB — TSH: TSH: 0.607 u[IU]/mL (ref 0.450–4.500)

## 2022-06-07 LAB — LIPID PANEL WITH LDL/HDL RATIO
Cholesterol, Total: 178 mg/dL (ref 100–199)
HDL: 68 mg/dL (ref >39)
LDL Chol Calc (NIH): 99 mg/dL (ref 0–99)
LDL/HDL Ratio: 1.5 ratio (ref 0.0–3.2)
Triglycerides: 56 mg/dL (ref 0–149)
VLDL Cholesterol Cal: 11 mg/dL (ref 5–40)

## 2022-07-04 ENCOUNTER — Ambulatory Visit: Payer: BC Managed Care – PPO | Admitting: Physician Assistant

## 2022-07-07 ENCOUNTER — Ambulatory Visit (INDEPENDENT_AMBULATORY_CARE_PROVIDER_SITE_OTHER): Payer: BC Managed Care – PPO | Admitting: Physician Assistant

## 2022-07-07 ENCOUNTER — Encounter: Payer: Self-pay | Admitting: Physician Assistant

## 2022-07-07 VITALS — BP 136/81 | HR 86 | Resp 16 | Wt 146.0 lb

## 2022-07-07 DIAGNOSIS — Z538 Procedure and treatment not carried out for other reasons: Secondary | ICD-10-CM

## 2022-07-07 NOTE — Progress Notes (Signed)
     I,April Miller,acting as a Education administrator for [employer], PA-C.,have documented all relevant documentation on the behalf of [name], PA-C,as directed by  [name], PA-C while in the presence of [name], PA-C.   Established patient visit   Patient: Terri Weaver   DOB: 11/18/1955   66 y.o. Female  MRN: [id number] Visit Date: 07/07/2022  Today's healthcare provider: [name], PA-C   Subjective    HPI  Appointment today was made by mistake. Pt is doing well with no concerns but wanted to make sure she didn't miss anything.  Medications: Outpatient Medications Prior to Visit  Medication Sig   atorvastatin (LIPITOR) 20 MG tablet Take 20 mg by mouth daily.   BIOTIN PO Take 1 tablet by mouth daily.    Calcium-Vitamin D-Vitamin K 500-100-40 MG-UNT-MCG CHEW Chew 1 tablet by mouth daily.    COLLAGEN PO Take 1 tablet by mouth daily.    escitalopram (LEXAPRO) 10 MG tablet TAKE 1 TABLET BY MOUTH EVERY DAY   KRILL OIL PO Take by mouth.   METAMUCIL FIBER PO Take by mouth.   Multiple Vitamins-Minerals (MULTIVITAMIN GUMMIES ADULT) CHEW Chew 1 tablet by mouth daily.    [DISCONTINUED] loratadine (CLARITIN) 10 MG tablet Take 10 mg by mouth daily as needed.    No facility-administered medications prior to visit.     Objective    BP 136/81 (BP Location: Right Arm, Patient Position: Sitting, Cuff Size: Normal)   Pulse 86   Resp 16   Wt 146 lb (66.2 kg)   SpO2 98%   BMI 25.46 kg/m   I, [name], PA-C have reviewed all documentation for this visit. The documentation on  07/07/2022  for the exam, diagnosis, procedures, and orders are all accurate and complete.  [name], PA-C [facility] [address] [place], [place], [address] Office: [phone] Fax: [place]

## 2022-09-12 DIAGNOSIS — H353132 Nonexudative age-related macular degeneration, bilateral, intermediate dry stage: Secondary | ICD-10-CM | POA: Diagnosis not present

## 2022-09-12 DIAGNOSIS — H43813 Vitreous degeneration, bilateral: Secondary | ICD-10-CM | POA: Diagnosis not present

## 2022-10-22 ENCOUNTER — Other Ambulatory Visit: Payer: Self-pay | Admitting: Physician Assistant

## 2022-10-22 DIAGNOSIS — F410 Panic disorder [episodic paroxysmal anxiety] without agoraphobia: Secondary | ICD-10-CM

## 2022-12-19 ENCOUNTER — Ambulatory Visit: Payer: BC Managed Care – PPO | Admitting: Physician Assistant

## 2023-01-10 ENCOUNTER — Ambulatory Visit (INDEPENDENT_AMBULATORY_CARE_PROVIDER_SITE_OTHER): Payer: PPO | Admitting: Physician Assistant

## 2023-01-10 ENCOUNTER — Encounter: Payer: Self-pay | Admitting: Physician Assistant

## 2023-01-10 VITALS — BP 126/86 | HR 92 | Ht 63.0 in | Wt 152.3 lb

## 2023-01-10 DIAGNOSIS — R030 Elevated blood-pressure reading, without diagnosis of hypertension: Secondary | ICD-10-CM | POA: Diagnosis not present

## 2023-01-10 DIAGNOSIS — F411 Generalized anxiety disorder: Secondary | ICD-10-CM | POA: Diagnosis not present

## 2023-01-10 DIAGNOSIS — Z13820 Encounter for screening for osteoporosis: Secondary | ICD-10-CM

## 2023-01-10 DIAGNOSIS — Z1231 Encounter for screening mammogram for malignant neoplasm of breast: Secondary | ICD-10-CM

## 2023-01-10 DIAGNOSIS — E7849 Other hyperlipidemia: Secondary | ICD-10-CM | POA: Diagnosis not present

## 2023-01-10 MED ORDER — ATORVASTATIN CALCIUM 20 MG PO TABS: 20.0000 mg | ORAL_TABLET | Freq: Every day | ORAL | 3 refills | Status: DC

## 2023-01-10 NOTE — Progress Notes (Deleted)
     I,Sha'taria Tyson,acting as a Neurosurgeon for [employer], PA-C.,have documented all relevant documentation on the behalf of [name], PA-C,as directed by  [name], PA-C while in the presence of [name], PA-C.   Established patient visit   Patient: Terri Weaver   DOB: 1956-02-25   67 y.o. Female  MRN: [id number] Visit Date: 01/10/2023  Today's healthcare provider: [name], PA-C   No chief complaint on file.  Subjective    HPI  -Pneumonia Vaccine: declined -Shingles Vaccine: declined Anxiety, Follow-up  She was last seen for anxiety 6 months ago. Changes made at last visit include stable on lexapro 10 mg .   She reports excellent compliance with treatment. She reports excellent tolerance of treatment. She is not having side effects. {document side effects if present:1}  She feels her anxiety is mild and Improved since last visit.  Symptoms: No chest pain No difficulty concentrating  No dizziness No fatigue  No feelings of losing control No insomnia  No irritable No palpitations  No panic attacks No racing thoughts  No shortness of breath No sweating  No tremors/shakes    GAD-7 Results    12/03/2021    8:24 AM 10/19/2021    9:01 AM 05/05/2020    8:09 AM  GAD-7 Generalized Anxiety Disorder Screening Tool  1. Feeling Nervous, Anxious, or on Edge 0 2 0  2. Not Being Able to Stop or Control Worrying 0 0 0  3. Worrying Too Much About Different Things 0 0 0  4. Trouble Relaxing 0 1 0  5. Being So Restless it's Hard To Sit Still 0 1 0  6. Becoming Easily Annoyed or Irritable 0 0 0  7. Feeling Afraid As If Something Awful Might Happen 0 1 0  Total GAD-7 Score 0 5 0  Difficulty At Work, Home, or Getting  Along With Others? Not difficult at all Somewhat difficult Not difficult at all    PHQ-9 Scores    06/06/2022    8:58 AM 12/03/2021    8:23 AM 10/19/2021    8:27 AM  PHQ9 SCORE ONLY  PHQ-9 Total Score 1 0 0   ---------------------------------------------------------------------------------------------------   Medications: Outpatient Medications Prior to Visit  Medication Sig   atorvastatin (LIPITOR) 20 MG tablet Take 20 mg by mouth daily.   BIOTIN PO Take 1 tablet by mouth daily.    Calcium-Vitamin D-Vitamin K 500-100-40 MG-UNT-MCG CHEW Chew 1 tablet by mouth daily.    COLLAGEN PO Take 1 tablet by mouth daily.    escitalopram (LEXAPRO) 10 MG tablet TAKE 1 TABLET BY MOUTH EVERY DAY   KRILL OIL PO Take by mouth.   METAMUCIL FIBER PO Take by mouth.   Multiple Vitamins-Minerals (MULTIVITAMIN GUMMIES ADULT) CHEW Chew 1 tablet by mouth daily.    No facility-administered medications prior to visit.    Review of Systems  {Labs  Heme  Chem  Endocrine  Serology  Results Review (optional):23779}   Objective    There were no vitals taken for this visit. {Show previous vital signs (optional):23777}  Physical Exam  ***  No results found for any visits on 01/10/23.  Assessment & Plan     ***  No follow-ups on file.      {provider attestation***:1}   [name], PA-C  [facility] [phone] (phone) [phone] (fax)  [facility]

## 2023-01-10 NOTE — Assessment & Plan Note (Signed)
In appropriate range today

## 2023-01-10 NOTE — Assessment & Plan Note (Addendum)
W/ history of panic attacks Stable on lexapro 10 mg  No recent hx of panic attacks F/u 6 mo

## 2023-01-10 NOTE — Patient Instructions (Signed)
Please contact [phone] to schedule your mammogram. They will ask which location you prefer to be seen in. You have two options listed below.  1) [facility] located at [address] [place], [place] [address] 2) [facility] [place] located at [address] [place], [place] [address]  Please feel free to contact us if you have any further questions or concerns  

## 2023-01-10 NOTE — Assessment & Plan Note (Signed)
Pt had stopped statin, advised she continue

## 2023-01-10 NOTE — Progress Notes (Signed)
Established patient visit   Patient: Terri Weaver   DOB: 25-Jul-1956   67 y.o. Female  MRN: [id number] Visit Date: 01/10/2023  Today's healthcare provider: [name], PA-C   Cc. Anxiety, high BP f/u  Subjective    HPI  Anxiety, Follow-up  She reports excellent compliance with treatment. She reports good tolerance of treatment. She is not having side effects.   She feels her anxiety is mild and Improved since last visit.  GAD-7 Results    01/10/2023    8:28 AM 12/03/2021    8:24 AM 10/19/2021    9:01 AM  GAD-7 Generalized Anxiety Disorder Screening Tool  1. Feeling Nervous, Anxious, or on Edge 0 0 2  2. Not Being Able to Stop or Control Worrying 0 0 0  3. Worrying Too Much About Different Things 0 0 0  4. Trouble Relaxing 0 0 1  5. Being So Restless it's Hard To Sit Still 0 0 1  6. Becoming Easily Annoyed or Irritable 0 0 0  7. Feeling Afraid As If Something Awful Might Happen 0 0 1  Total GAD-7 Score 0 0 5  Difficulty At Work, Home, or Getting  Along With Others? Not difficult at all Not difficult at all Somewhat difficult    PHQ-9 Scores    01/10/2023    8:30 AM 06/06/2022    8:58 AM 12/03/2021    8:23 AM  PHQ9 SCORE ONLY  PHQ-9 Total Score 0 1 0   --------------------------------------------------------------------------------------------------  Hypertension, follow-up  BP Readings from Last 3 Encounters:  01/10/23 126/86  07/07/22 136/81  06/06/22 (!) 141/87   Wt Readings from Last 3 Encounters:  01/10/23 152 lb 4.8 oz (69.1 kg)  07/07/22 146 lb (66.2 kg)  06/06/22 146 lb (66.2 kg)     Pt has had elevated BP in the office previously.   Medications: Outpatient Medications Prior to Visit  Medication Sig   BIOTIN PO Take 1 tablet by mouth daily.    Calcium-Vitamin D-Vitamin K 500-100-40 MG-UNT-MCG CHEW Chew 1 tablet by mouth daily.    COLLAGEN PO Take 1 tablet by mouth daily.    escitalopram (LEXAPRO) 10 MG tablet TAKE 1 TABLET BY MOUTH EVERY  DAY   KRILL OIL PO Take by mouth.   METAMUCIL FIBER PO Take by mouth.   [DISCONTINUED] atorvastatin (LIPITOR) 20 MG tablet Take 20 mg by mouth daily.   [DISCONTINUED] Multiple Vitamins-Minerals (MULTIVITAMIN GUMMIES ADULT) CHEW Chew 1 tablet by mouth daily.    No facility-administered medications prior to visit.   Review of Systems  Constitutional:  Negative for fatigue and fever.  Respiratory:  Negative for cough and shortness of breath.   Cardiovascular:  Negative for chest pain and leg swelling.  Gastrointestinal:  Negative for abdominal pain.  Neurological:  Negative for dizziness and headaches.      Objective    BP 126/86 (BP Location: Left Arm, Patient Position: Sitting, Cuff Size: Normal)   Pulse 92   Ht 5\' 3"  (1.6 m)   Wt 152 lb 4.8 oz (69.1 kg)   SpO2 98%   BMI 26.98 kg/m   Physical Exam Constitutional:      General: She is awake.     Appearance: She is well-developed.  HENT:     Head: Normocephalic.  Eyes:     Conjunctiva/sclera: Conjunctivae normal.  Cardiovascular:     Rate and Rhythm: Normal rate and regular rhythm.     Heart sounds: Normal heart sounds.  Pulmonary:  Effort: Pulmonary effort is normal.     Breath sounds: Normal breath sounds.  Skin:    General: Skin is warm.  Neurological:     Mental Status: She is alert and oriented to person, place, and time.  Psychiatric:        Attention and Perception: Attention normal.        Mood and Affect: Mood normal.        Speech: Speech normal.        Behavior: Behavior is cooperative.     No results found for any visits on 01/10/23.  Assessment & Plan     Problem List Items Addressed This Visit       Other   GAD (generalized anxiety disorder) - Primary    W/ history of panic attacks Stable on lexapro 10 mg  No recent hx of panic attacks F/u 6 mo       Other hyperlipidemia    Pt had stopped statin, advised she continue       Relevant Medications   atorvastatin (LIPITOR) 20 MG tablet    Elevated blood pressure reading in office without diagnosis of hypertension    In appropriate range today      Other Visit Diagnoses     Osteoporosis screening       Relevant Orders   DG Bone Density   Breast cancer screening by mammogram       Relevant Orders   MM 3D SCREENING MAMMOGRAM BILATERAL BREAST        Return in about 6 months (around 07/13/2023) for AVW--inital.      I, [name], PA-C have reviewed all documentation for this visit. The documentation on  01/10/23   for the exam, diagnosis, procedures, and orders are all accurate and complete.  [name], PA-C [facility] [address] [place], [place], [address] Office: [phone] Fax: [phone]   [facility]

## 2023-01-24 ENCOUNTER — Ambulatory Visit: Payer: Self-pay

## 2023-01-24 ENCOUNTER — Telehealth: Payer: PPO | Admitting: Family Medicine

## 2023-01-24 DIAGNOSIS — W57XXXA Bitten or stung by nonvenomous insect and other nonvenomous arthropods, initial encounter: Secondary | ICD-10-CM

## 2023-01-24 DIAGNOSIS — S0086XA Insect bite (nonvenomous) of other part of head, initial encounter: Secondary | ICD-10-CM

## 2023-01-24 MED ORDER — DOXYCYCLINE HYCLATE 100 MG PO TABS: 100.0000 mg | ORAL_TABLET | Freq: Two times a day (BID) | ORAL | 0 refills | Status: AC

## 2023-01-24 NOTE — Patient Instructions (Signed)
  Terri Weaver, thank you for joining Freddy Finner, NP for today's virtual visit.  While this provider is not your primary care provider (PCP), if your PCP is located in our provider database this encounter information will be shared with them immediately following your visit.   A [facility] MyChart account gives you access to today's visit and all your visits, tests, and labs performed at [facility] " click here if you don't have a National MyChart account or go to [email or web address]  Consent: (Patient) Terri Weaver provided verbal consent for this virtual visit at the beginning of the encounter.  Current Medications:  Current Outpatient Medications:    doxycycline (VIBRA-TABS) 100 MG tablet, Take 1 tablet (100 mg total) by mouth 2 (two) times daily for 10 days., Disp: 20 tablet, Rfl: 0   atorvastatin (LIPITOR) 20 MG tablet, Take 1 tablet (20 mg total) by mouth daily., Disp: 90 tablet, Rfl: 3   BIOTIN PO, Take 1 tablet by mouth daily. , Disp: , Rfl:    Calcium-Vitamin D-Vitamin K 500-100-40 MG-UNT-MCG CHEW, Chew 1 tablet by mouth daily. , Disp: , Rfl:    COLLAGEN PO, Take 1 tablet by mouth daily. , Disp: , Rfl:    escitalopram (LEXAPRO) 10 MG tablet, TAKE 1 TABLET BY MOUTH EVERY DAY, Disp: 90 tablet, Rfl: 3   KRILL OIL PO, Take by mouth., Disp: , Rfl:    METAMUCIL FIBER PO, Take by mouth., Disp: , Rfl:    Medications ordered in this encounter:  Meds ordered this encounter  Medications   doxycycline (VIBRA-TABS) 100 MG tablet    Sig: Take 1 tablet (100 mg total) by mouth 2 (two) times daily for 10 days.    Dispense:  20 tablet    Refill:  0    Order Specific Question:   Supervising Provider    Answer:   Merrilee Jansky [id number]     *If you need refills on other medications prior to your next appointment, please contact your pharmacy*  Follow-Up: Call back or seek an in-person evaluation if the symptoms worsen or if the condition fails to improve  as anticipated.  [facility] [phone]  Other Instructions -keep area clean -low threshold for changes or anything that seems to be worsening -go be seen in person- vision changes, swelling increasing, fevers or body aches   If you have been instructed to have an in-person evaluation today at a local Urgent Care facility, please use the link below. It will take you to a list of all of our available [facility], including address, phone number and hours of operation. Please do not delay care.  [facility]  If you or a family member do not have a primary care provider, use the link below to schedule a visit and establish care. When you choose a [facility] primary care physician or advanced practice provider, you gain a long-term partner in health. Find a Primary Care Provider  Learn more about [facility] in-office and virtual care options: [facility] - Get Care Now

## 2023-01-24 NOTE — Telephone Encounter (Signed)
    Chief Complaint: Left eye, cheek puffy. Eye is "not swollen shut." No pain or itching. Thinks it is possible bug bite. Appointment tomorrow, asking to be worked in today. No answer on FC line. Symptoms: Above Frequency: Today Pertinent Negatives: Patient denies  Disposition: [] ED /[] Urgent Care (no appt availability in office) / [x] Appointment(In office/virtual)/ []  [facility]/ [] Home Care/ [] Refused Recommended Disposition /[] [facility]/ []  Follow-up with PCP Additional Notes: Please advise pt. Thanks.  Reason for Disposition  [1] Mild face swelling (puffiness) AND [2] persists > 3 days  Answer Assessment - Initial Assessment Questions 1. ONSET: "When did the swelling start?" (e.g., minutes, hours, days)     Today 2. LOCATION: "What part of the face is swollen?"     Left eye and cheek 3. SEVERITY: "How swollen is it?"     Moderate 4. ITCHING: "Is there any itching?" If Yes, ask: "How much?"   (Scale 1-10; mild, moderate or severe)     No 5. PAIN: "Is the swelling painful to touch?" If Yes, ask: "How painful is it?"   (Scale 1-10; mild, moderate or severe)   - NONE (0): no pain   - MILD (1-3): doesn't interfere with normal activities    - MODERATE (4-7): interferes with normal activities or awakens from sleep    - SEVERE (8-10): excruciating pain, unable to do any normal activities      No 6. FEVER: "Do you have a fever?" If Yes, ask: "What is it, how was it measured, and when did it start?"      No 7. CAUSE: "What do you think is causing the face swelling?"     Maybe a bug bite 8. RECURRENT SYMPTOM: "Have you had face swelling before?" If Yes, ask: "When was the last time?" "What happened that time?"     No 9. OTHER SYMPTOMS: "Do you have any other symptoms?" (e.g., toothache, leg swelling)     No 10. PREGNANCY: "Is there any chance you are pregnant?" "When was your last menstrual period?"       No  Protocols used: Face Swelling-A-AH

## 2023-01-24 NOTE — Progress Notes (Signed)
Virtual Visit Consent   Terri Weaver, you are scheduled for a virtual visit with a [facility] provider today. Just as with appointments in the office, your consent must be obtained to participate. Your consent will be active for this visit and any virtual visit you may have with one of our providers in the next 365 days. If you have a MyChart account, a copy of this consent can be sent to you electronically.  As this is a virtual visit, video technology does not allow for your provider to perform a traditional examination. This may limit your provider's ability to fully assess your condition. If your provider identifies any concerns that need to be evaluated in person or the need to arrange testing (such as labs, EKG, etc.), we will make arrangements to do so. Although advances in technology are sophisticated, we cannot ensure that it will always work on either your end or our end. If the connection with a video visit is poor, the visit may have to be switched to a telephone visit. With either a video or telephone visit, we are not always able to ensure that we have a secure connection.  By engaging in this virtual visit, you consent to the provision of healthcare and authorize for your insurance to be billed (if applicable) for the services provided during this visit. Depending on your insurance coverage, you may receive a charge related to this service.  I need to obtain your verbal consent now. Are you willing to proceed with your visit today? LILLIENNE INGEMI has provided verbal consent on 01/24/2023 for a virtual visit (video or telephone). Freddy Finner, NP  Date: 01/24/2023 3:27 PM  Virtual Visit via Video Note   I, Freddy Finner, connected with  ADILENNE AHLES  ([id number], [birth year]) on 01/24/23 at  3:30 PM EDT by a video-enabled telemedicine application and verified that I am speaking with the correct person using two identifiers.  Location: Patient: Virtual Visit Location  Patient: Home Provider: Virtual Visit Location Provider: Home Office   I discussed the limitations of evaluation and management by telemedicine and the availability of in person appointments. The patient expressed understanding and agreed to proceed.    History of Present Illness: Terri Weaver is a 67 y.o. who identifies as a female who was assigned female at birth, and is being seen today for insect bite   Onset was insect bite on left cheek- hard and firm and warm with pressure building-  Associated symptoms are painful to touch and redness with mild swelling to the area Modifying factors are benadryl at 2pm this afternoon and OTC oral antihistamine -took this morning  Denies vision changes, no drainage, no eye involvement, chest pain, shortness of breath, fevers, chills  Has recently had tick bite, and history of lymes   Problems:  Patient Active Problem List   Diagnosis Date Noted   Macular degeneration, dry 06/06/2022   Elevated hemoglobin (HCC) 06/06/2022   Tobacco use 06/06/2022   Elevated blood pressure reading in office without diagnosis of hypertension 06/06/2022   History of adenomatous polyp of colon    Other hyperlipidemia 12/03/2021   GAD (generalized anxiety disorder) 10/19/2021   Seborrheic keratosis 10/19/2021   Adenomatous polyp of sigmoid colon 07/20/2018   Lyme disease 03/27/2017   Tuberculosis 03/21/2017   Cyst of ovary 03/21/2017   Cannot sleep 03/21/2017    Allergies:  Allergies  Allergen Reactions   Chantix  [Varenicline] Itching   Penicillins  Medications:  Current Outpatient Medications:    atorvastatin (LIPITOR) 20 MG tablet, Take 1 tablet (20 mg total) by mouth daily., Disp: 90 tablet, Rfl: 3   BIOTIN PO, Take 1 tablet by mouth daily. , Disp: , Rfl:    Calcium-Vitamin D-Vitamin K 500-100-40 MG-UNT-MCG CHEW, Chew 1 tablet by mouth daily. , Disp: , Rfl:    COLLAGEN PO, Take 1 tablet by mouth daily. , Disp: , Rfl:    escitalopram (LEXAPRO)  10 MG tablet, TAKE 1 TABLET BY MOUTH EVERY DAY, Disp: 90 tablet, Rfl: 3   KRILL OIL PO, Take by mouth., Disp: , Rfl:    METAMUCIL FIBER PO, Take by mouth., Disp: , Rfl:   Observations/Objective: Patient is well-developed, well-nourished in no acute distress.  Resting comfortably  at home.  Head is normocephalic, atraumatic.  No labored breathing.  Speech is clear and coherent with logical content.  Patient is alert and oriented at baseline.  Red swelling eye under the left - across cheek  Assessment and Plan:  1. Insect bite of other part of head, initial encounter  - doxycycline (VIBRA-TABS) 100 MG tablet; Take 1 tablet (100 mg total) by mouth 2 (two) times daily for 10 days.  Dispense: 20 tablet; Refill: 0  -keep area clean -low threshold for changes or anything that seems to be worsening -go be seen in person- vision changes, swelling increasing, fevers or body aches   Reviewed side effects, risks and benefits of medication.    Patient acknowledged agreement and understanding of the plan.   Past Medical, Surgical, Social History, Allergies, and Medications have been Reviewed.     Follow Up Instructions: I discussed the assessment and treatment plan with the patient. The patient was provided an opportunity to ask questions and all were answered. The patient agreed with the plan and demonstrated an understanding of the instructions.  A copy of instructions were sent to the patient via MyChart unless otherwise noted below.   The patient was advised to call back or seek an in-person evaluation if the symptoms worsen or if the condition fails to improve as anticipated.  Time:  I spent 10 minutes with the patient via telehealth technology discussing the above problems/concerns.    Freddy Finner, NP

## 2023-01-25 ENCOUNTER — Encounter (INDEPENDENT_AMBULATORY_CARE_PROVIDER_SITE_OTHER): Payer: PPO | Admitting: Physician Assistant

## 2023-01-25 ENCOUNTER — Ambulatory Visit: Payer: PPO | Admitting: Physician Assistant

## 2023-01-25 DIAGNOSIS — L03211 Cellulitis of face: Secondary | ICD-10-CM

## 2023-01-25 NOTE — Telephone Encounter (Signed)
Please see the MyChart message reply(ies) for my assessment and plan.  ?  ?This patient gave consent for this Medical Advice Message and is aware that it may result in a bill to their insurance company, as well as the possibility of receiving a bill for a co-payment or deductible. They are an established patient, but are not seeking medical advice exclusively about a problem treated during an in person or video visit in the last seven days. I did not recommend an in person or video visit within seven days of my reply.  ?  ?I spent a total of 7 minutes cumulative time within 7 days through MyChart messaging. ? ?Panda Crossin, PA-C  ? ?

## 2023-02-15 ENCOUNTER — Other Ambulatory Visit: Payer: PPO

## 2023-04-11 ENCOUNTER — Ambulatory Visit
Admission: RE | Admit: 2023-04-11 | Discharge: 2023-04-11 | Disposition: A | Discharge status: Home / Self Care | Payer: PPO | Source: Ambulatory Visit | Attending: Physician Assistant | Admitting: Physician Assistant

## 2023-04-11 DIAGNOSIS — Z1231 Encounter for screening mammogram for malignant neoplasm of breast: Secondary | ICD-10-CM | POA: Insufficient documentation

## 2023-04-11 DIAGNOSIS — M810 Age-related osteoporosis without current pathological fracture: Secondary | ICD-10-CM | POA: Insufficient documentation

## 2023-04-11 DIAGNOSIS — Z780 Asymptomatic menopausal state: Secondary | ICD-10-CM | POA: Insufficient documentation

## 2023-04-11 DIAGNOSIS — Z13820 Encounter for screening for osteoporosis: Secondary | ICD-10-CM | POA: Diagnosis not present

## 2023-04-25 DIAGNOSIS — H353132 Nonexudative age-related macular degeneration, bilateral, intermediate dry stage: Secondary | ICD-10-CM | POA: Diagnosis not present

## 2023-04-25 DIAGNOSIS — H43813 Vitreous degeneration, bilateral: Secondary | ICD-10-CM | POA: Diagnosis not present

## 2023-04-25 DIAGNOSIS — H2513 Age-related nuclear cataract, bilateral: Secondary | ICD-10-CM | POA: Diagnosis not present

## 2023-04-26 ENCOUNTER — Other Ambulatory Visit: Payer: Self-pay | Admitting: Family Medicine

## 2023-04-26 ENCOUNTER — Encounter: Payer: Self-pay | Admitting: Family Medicine

## 2023-04-26 DIAGNOSIS — M810 Age-related osteoporosis without current pathological fracture: Secondary | ICD-10-CM

## 2023-04-26 MED ORDER — ALENDRONATE SODIUM 70 MG PO TABS
70.0000 mg | ORAL_TABLET | ORAL | 11 refills | Status: DC
Start: 2023-04-26 — End: 2023-12-08

## 2023-05-02 DIAGNOSIS — M810 Age-related osteoporosis without current pathological fracture: Secondary | ICD-10-CM | POA: Diagnosis not present

## 2023-05-02 IMAGING — DX DG CHEST 2V
2 series · 2 of 2 positions shown · non-contrast
Comparison: 02/03/2020

CLINICAL DATA: Cough, sore throat

EXAM:
CHEST - 2 VIEW

[chest pa]
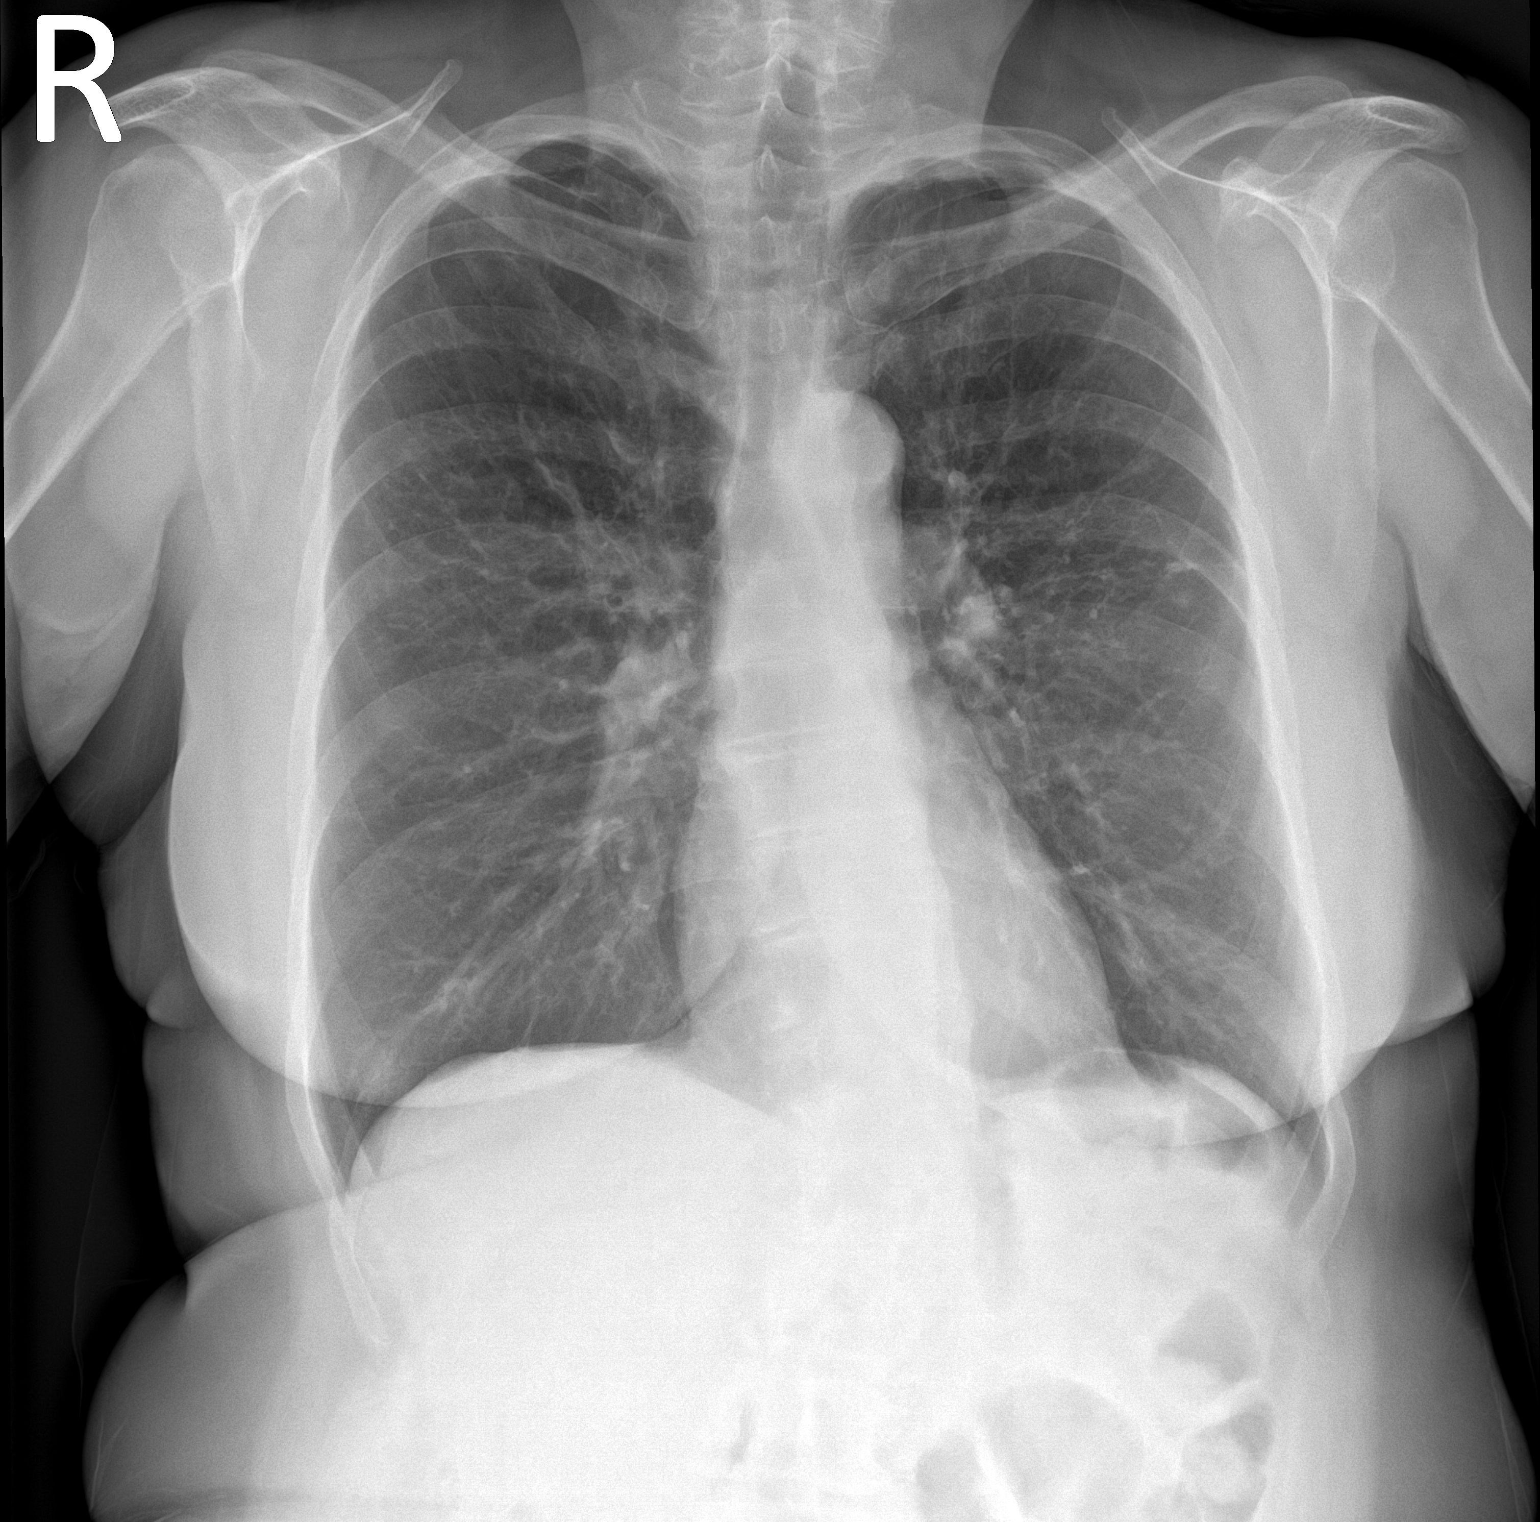

[chest lat]
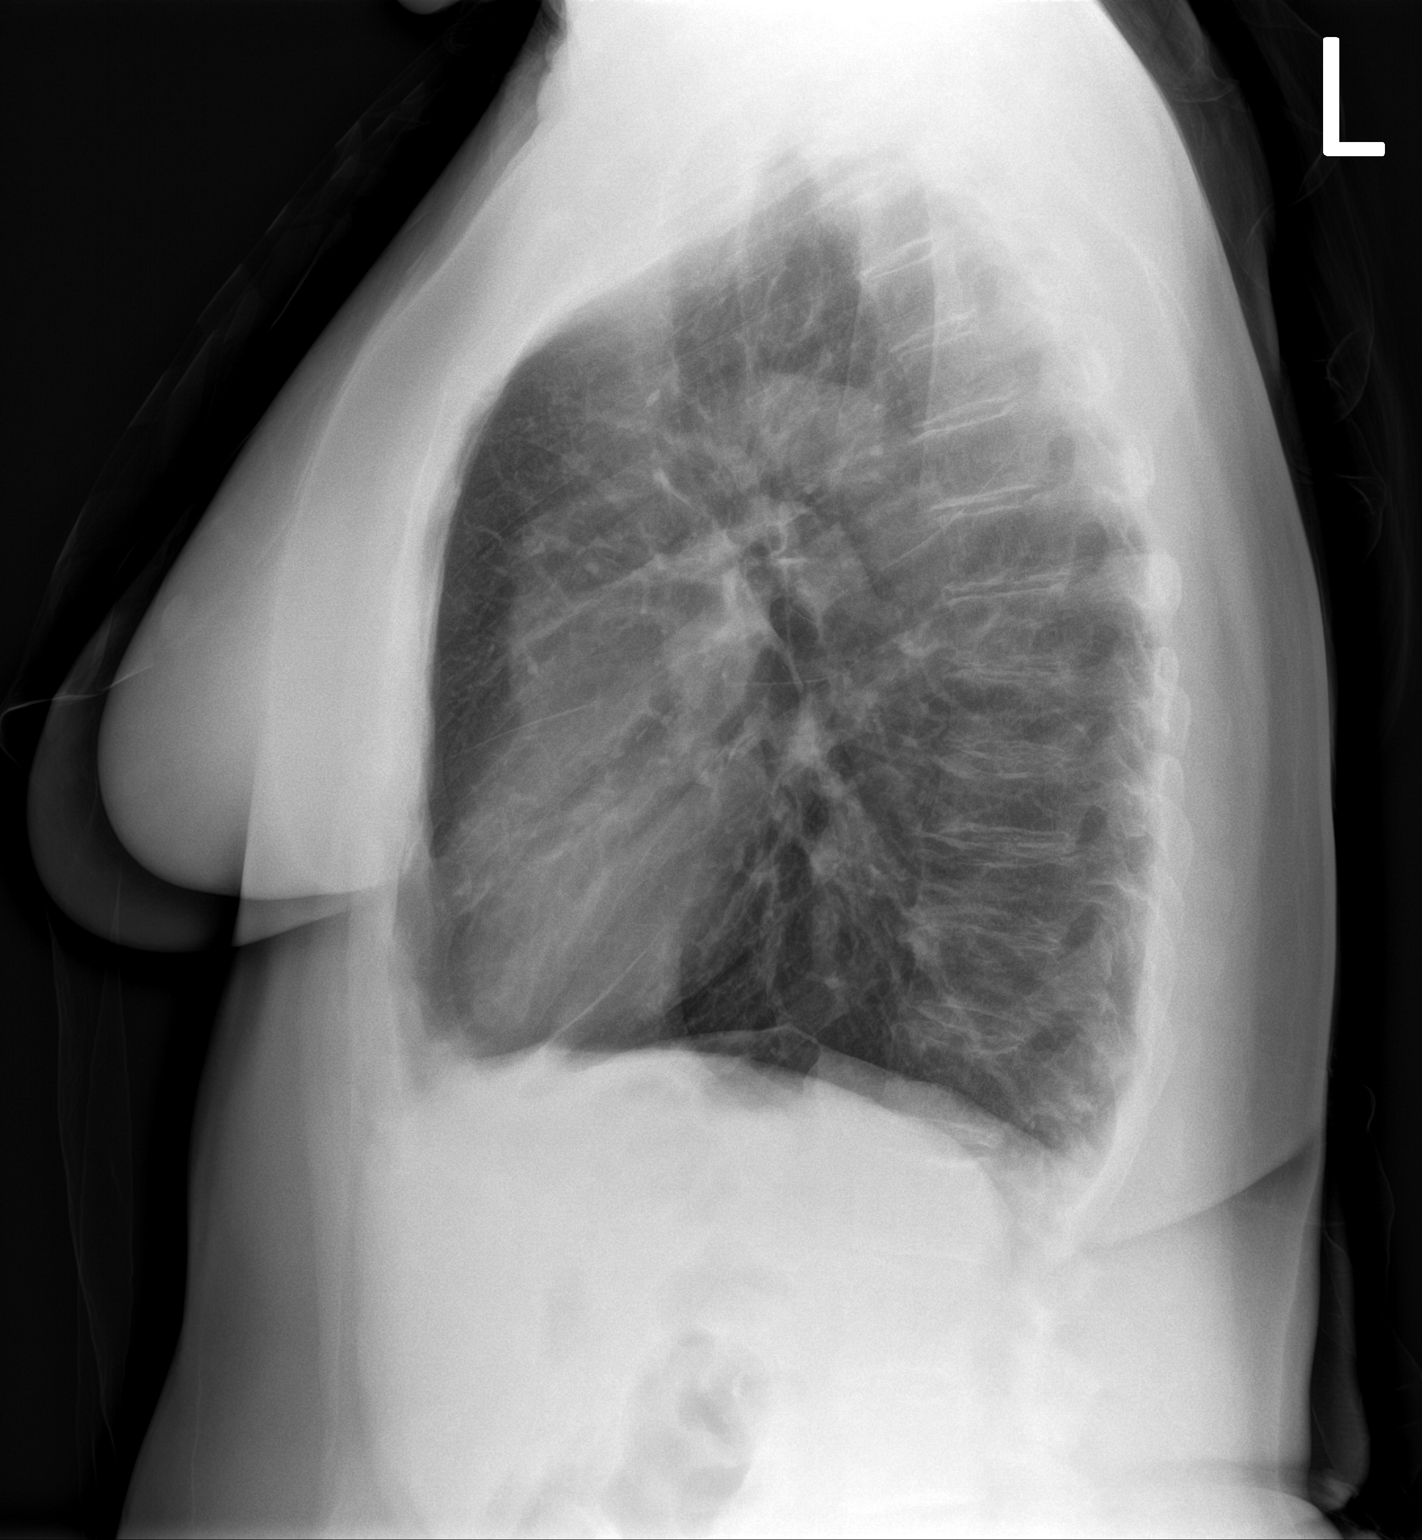

[2 of 2 positions shown; findings below may reference images not displayed]

FINDINGS: The heart size and mediastinal contours are within normal limits.
Both lungs are clear. The visualized skeletal structures are
unremarkable.
IMPRESSION: No active cardiopulmonary disease.

## 2023-05-11 LAB — VITAMIN D 25 HYDROXY (VIT D DEFICIENCY, FRACTURES): Vit D, 25-Hydroxy: 37.8 ng/mL (ref 30.0–100.0)

## 2023-05-11 LAB — VITAMIN D 1,25 DIHYDROXY
Vitamin D 1, 25 (OH)2 Total: 68 pg/mL — ABNORMAL HIGH
Vitamin D2 1, 25 (OH)2: <10 pg/mL
Vitamin D3 1, 25 (OH)2: 68 pg/mL

## 2023-06-08 ENCOUNTER — Encounter: Payer: Self-pay | Admitting: Family Medicine

## 2023-06-08 ENCOUNTER — Encounter: Payer: PPO | Admitting: Physician Assistant

## 2023-06-08 ENCOUNTER — Ambulatory Visit: Payer: PPO | Admitting: Family Medicine

## 2023-06-08 VITALS — BP 116/74 | HR 104 | Ht 63.0 in | Wt 150.9 lb

## 2023-06-08 DIAGNOSIS — R739 Hyperglycemia, unspecified: Secondary | ICD-10-CM | POA: Diagnosis not present

## 2023-06-08 DIAGNOSIS — E7849 Other hyperlipidemia: Secondary | ICD-10-CM | POA: Diagnosis not present

## 2023-06-08 DIAGNOSIS — Z0000 Encounter for general adult medical examination without abnormal findings: Secondary | ICD-10-CM

## 2023-06-08 DIAGNOSIS — M810 Age-related osteoporosis without current pathological fracture: Secondary | ICD-10-CM | POA: Diagnosis not present

## 2023-06-08 DIAGNOSIS — R0609 Other forms of dyspnea: Secondary | ICD-10-CM | POA: Diagnosis not present

## 2023-06-08 DIAGNOSIS — Z716 Tobacco abuse counseling: Secondary | ICD-10-CM

## 2023-06-08 MED ORDER — ALBUTEROL SULFATE HFA 108 (90 BASE) MCG/ACT IN AERS
2.0000 | INHALATION_SPRAY | Freq: Four times a day (QID) | RESPIRATORY_TRACT | 2 refills | Status: DC | PRN
Start: 2023-06-08 — End: 2023-08-29

## 2023-06-08 MED ORDER — BUPROPION HCL ER (SR) 150 MG PO TB12: ORAL_TABLET | ORAL | 0 refills | Status: DC

## 2023-06-08 NOTE — Progress Notes (Signed)
Annual Wellness Visit     Patient: Terri Weaver, Female    DOB: [birth year], 67 y.o.   MRN: [id number] Visit Date: 06/08/2023  Today's Provider: Sherlyn Hay, DO   Chief Complaint  Patient presents with   Annual Exam   Subjective    Terri Weaver is a 67 y.o. female who presents today for her Annual Wellness Visit. She reports consuming a  whole-30  diet. The patient does not participate in regular exercise at present. She generally feels fairly well. She reports sleeping well. She does have additional problems to discuss today.   HPI  Diagnosed with dry macular degeneration 1.5 years ago. Has new prescriptions to pick up; follows up regularly.  Right ankle s/p fracture in 2019 bothering her; has appointment upcoming with ortho.   Have an advanced care plan? She is planning to do one.  Shortness of breath - fine for usual ADLs  - received albuterol for bronchitis last year and has been using it when now short of breath during physical intimacy. Does not otherwise experience shortness of breath when laying down.  Smoking - trying to just cut back  - chantix did great but she had a reaction to it (broke out in hives)  - Would like to try an alternative   Medications: Outpatient Medications Prior to Visit  Medication Sig   alendronate (FOSAMAX) 70 MG tablet Take 1 tablet (70 mg total) by mouth every 7 (seven) days. Take with a full glass of water on an empty stomach.   atorvastatin (LIPITOR) 20 MG tablet Take 1 tablet (20 mg total) by mouth daily.   BIOTIN PO Take 1 tablet by mouth daily.    Calcium-Vitamin D-Vitamin K 500-100-40 MG-UNT-MCG CHEW Chew 1 tablet by mouth daily.    COLLAGEN PO Take 1 tablet by mouth daily.    escitalopram (LEXAPRO) 10 MG tablet TAKE 1 TABLET BY MOUTH EVERY DAY   KRILL OIL PO Take by mouth.   METAMUCIL FIBER PO Take by mouth.   OVER THE COUNTER MEDICATION Take 1 tablet by mouth daily. Preservision Areds 2: Take 2 tablets daily  by mouth   No facility-administered medications prior to visit.    Allergies  Allergen Reactions   Chantix  [Varenicline] Itching   Penicillins     Patient Care Team: Anas Reister, Monico Blitz, DO as PCP - General (Family Medicine)  Review of Systems  Constitutional:  Negative for appetite change, chills, fatigue and fever.  Respiratory:  Positive for shortness of breath (see HPI). Negative for chest tightness.   Cardiovascular:  Negative for chest pain and palpitations.  Gastrointestinal:  Negative for abdominal pain, nausea and vomiting.  Neurological:  Negative for dizziness and weakness.         Objective    Vitals: BP 116/74 (BP Location: Left Arm, Patient Position: Sitting, Cuff Size: Normal)   Pulse (!) 104   Ht 5\' 3"  (1.6 m)   Wt 150 lb 14.4 oz (68.4 kg)   SpO2 94%   BMI 26.73 kg/m      Physical Exam Vitals and nursing note reviewed.  Constitutional:      General: She is not in acute distress.    Appearance: Normal appearance.  HENT:     Head: Normocephalic and atraumatic.  Eyes:     General: No scleral icterus.    Conjunctiva/sclera: Conjunctivae normal.  Cardiovascular:     Rate and Rhythm: Normal rate.  Pulmonary:     Effort:  Pulmonary effort is normal.  Neurological:     Mental Status: She is alert and oriented to person, place, and time. Mental status is at baseline.  Psychiatric:        Mood and Affect: Mood normal.        Behavior: Behavior normal.     Most recent functional status assessment:    06/08/2023    9:00 AM  In your present state of health, do you have any difficulty performing the following activities:  Hearing? 0  Vision? 1  Comment macular degeneration  Difficulty concentrating or making decisions? 0  Walking or climbing stairs? 1  Comment d/t broken ankle in 2019  Dressing or bathing? 0  Doing errands, shopping? 0   Most recent fall risk assessment:    06/08/2023    9:04 AM  Fall Risk   Falls in the past year? 0   Number falls in past yr: 0  Injury with Fall? 0  Risk for fall due to : Orthopedic patient    Most recent depression screenings:    06/08/2023    8:41 AM 01/10/2023    8:30 AM  PHQ 2/9 Scores  PHQ - 2 Score 0 0  PHQ- 9 Score 0 0   Most recent cognitive screening:    06/08/2023    9:04 AM  6CIT Screen  What Year? 0 points  What month? 0 points  What time? 0 points  Count back from 20 0 points  Months in reverse 0 points  Repeat phrase 0 points  Total Score 0 points   Most recent Audit-C alcohol use screening    06/08/2023    9:03 AM  Alcohol Use Disorder Test (AUDIT)  1. How often do you have a drink containing alcohol? 1  2. How many drinks containing alcohol do you have on a typical day when you are drinking? 1  3. How often do you have six or more drinks on one occasion? 0  AUDIT-C Score 2   A score of 3 or more in women, and 4 or more in men indicates increased risk for alcohol abuse, EXCEPT if all of the points are from question 1   Results for orders placed or performed in visit on 06/08/23  Comprehensive metabolic panel  Result Value Ref Range   Glucose 108 (H) 70 - 99 mg/dL   BUN 14 8 - 27 mg/dL   Creatinine, Ser 2.95 0.57 - 1.00 mg/dL   eGFR 83 >28 UX/LKG/4.01   BUN/Creatinine Ratio 18 12 - 28   Sodium 142 134 - 144 mmol/L   Potassium 5.0 3.5 - 5.2 mmol/L   Chloride 101 96 - 106 mmol/L   CO2 26 20 - 29 mmol/L   Calcium 9.8 8.7 - 10.3 mg/dL   Total Protein 7.3 6.0 - 8.5 g/dL   Albumin 4.8 3.9 - 4.9 g/dL   Globulin, Total 2.5 1.5 - 4.5 g/dL   Bilirubin Total 0.6 0.0 - 1.2 mg/dL   Alkaline Phosphatase 100 44 - 121 IU/L   AST 25 0 - 40 IU/L   ALT 18 0 - 32 IU/L  Lipid panel  Result Value Ref Range   Cholesterol, Total 202 (H) 100 - 199 mg/dL   Triglycerides 95 0 - 149 mg/dL   HDL 63 >02 mg/dL   VLDL Cholesterol Cal 17 5 - 40 mg/dL   LDL Chol Calc (NIH) 725 (H) 0 - 99 mg/dL   Chol/HDL Ratio 3.2 0.0 - 4.4 ratio  Hemoglobin  A1c  Result Value Ref  Range   Hgb A1c MFr Bld 5.7 (H) 4.8 - 5.6 %   Est. average glucose Bld gHb Est-mCnc 117 mg/dL    Assessment & Plan     Annual wellness visit done today including the all of the following: Reviewed patient's Family Medical History Reviewed and updated list of patient's medical providers Assessment of cognitive impairment was done Assessed patient's functional ability Established a written schedule for health screening services Health Risk Assessent Completed and Reviewed Patient is not currently on any opioid medications.   Exercise Activities and Dietary recommendations  Goals   None   QUIT SMOKING  Immunization History  Administered Date(s) Administered   Influenza, Quadrivalent, Recombinant, Inj, Pf 07/29/2021   Influenza,inj,Quad PF,6+ Mos 08/09/2020   Influenza-Unspecified 06/05/2019, 06/03/2022   Moderna Sars-Covid-2 Vaccination 08/09/2020   PFIZER(Purple Top)SARS-COV-2 Vaccination 12/03/2019, 12/24/2019   Pfizer(Comirnaty)Fall Seasonal Vaccine 12 years and older 06/28/2022   Tdap 12/06/2006, 02/06/2020    Health Maintenance  Topic Date Due   Pneumonia Vaccine 27+ Years old (1 of 2 - PCV) Never done   COVID-19 Vaccine (5 - 2023-24 season) 06/24/2023 (Originally 04/30/2023)   Zoster Vaccines- Shingrix (1 of 2) 06/30/2023 (Originally [birth year]/1976)   INFLUENZA VACCINE  11/27/2023 (Originally 03/30/2023)   MAMMOGRAM  04/10/2024   Medicare Annual Wellness (AWV)  06/07/2024   Colonoscopy  12/25/2026   DTaP/Tdap/Td (3 - Td or Tdap) 02/05/2030   DEXA SCAN  Completed   Hepatitis C Screening  Completed   HIV Screening  Completed   HPV VACCINES  Aged Out     Discussed health benefits of physical activity, and encouraged her to engage in regular exercise appropriate for her age and condition.     Encounter for initial annual wellness visit (AWV) in Medicare patient  Age-related osteoporosis without current pathological fracture Assessment & Plan: Patient would like to  consider Prolia.  Will go ahead and refer her to endocrinology to discuss this versus other options, as well as their benefits versus side effects.  She may be considering dental surgery within the coming year or two.  Orders: -     Ambulatory referral to Endocrinology  Encounter for smoking cessation counseling Assessment & Plan: Counseled patient on smoking cessation.  Will go ahead with bupropion medication-assisted option for cessation, prescribed as noted below.  Orders: -     buPROPion HCl ER (SR); 150 mg orally once daily in the morning for 3 days, then increase to 150 mg orally twice daily, with at least 8 hours between doses  Dispense: 57 tablet; Refill: 0  Dyspnea on exertion Assessment & Plan: Patient's symptoms have been improved using albuterol inhaler.  Will refill as noted today.  Advised patient to let us know if her symptoms start to become more frequent and/or worse and we will consider a pulmonary function test at that point.  Orders: -     Albuterol Sulfate HFA; Inhale 2 puffs into the lungs every 6 (six) hours as needed for wheezing or shortness of breath.  Dispense: 8 g; Refill: 2  Other hyperlipidemia Assessment & Plan: No acute concerns.  Continue to monitor. Will check lipid panel and CMP today.  Orders: -     Comprehensive metabolic panel -     Lipid panel  Hyperglycemia Assessment & Plan: History of hyperglycemia.  Will recheck A1c today.  Orders: -     Hemoglobin A1c    Return in about 6 months (around 12/07/2023) for CPE, tobacco.  I discussed the assessment and treatment plan with the patient  The patient was provided an opportunity to ask questions and all were answered. The patient agreed with the plan and demonstrated an understanding of the instructions.   The patient was advised to call back or seek an in-person evaluation if the symptoms worsen or if the condition fails to improve as anticipated.    Sherlyn Hay, DO  [facility]  [facility] [phone] (phone) [phone] (fax)  [facility]

## 2023-06-08 NOTE — Patient Instructions (Addendum)
Get Shingles vaccine and pneumonia vaccine (prevnar-20) at pharmacy

## 2023-06-09 ENCOUNTER — Encounter: Payer: Self-pay | Admitting: Family Medicine

## 2023-06-09 DIAGNOSIS — M810 Age-related osteoporosis without current pathological fracture: Secondary | ICD-10-CM | POA: Insufficient documentation

## 2023-06-09 DIAGNOSIS — R0609 Other forms of dyspnea: Secondary | ICD-10-CM | POA: Insufficient documentation

## 2023-06-09 DIAGNOSIS — R739 Hyperglycemia, unspecified: Secondary | ICD-10-CM | POA: Insufficient documentation

## 2023-06-09 LAB — LIPID PANEL
Chol/HDL Ratio: 3.2 {ratio} (ref 0.0–4.4)
Cholesterol, Total: 202 mg/dL — ABNORMAL HIGH (ref 100–199)
HDL: 63 mg/dL (ref >39)
LDL Chol Calc (NIH): 122 mg/dL — ABNORMAL HIGH (ref 0–99)
Triglycerides: 95 mg/dL (ref 0–149)
VLDL Cholesterol Cal: 17 mg/dL (ref 5–40)

## 2023-06-09 LAB — COMPREHENSIVE METABOLIC PANEL
ALT: 18 [IU]/L (ref 0–32)
AST: 25 [IU]/L (ref 0–40)
Albumin: 4.8 g/dL (ref 3.9–4.9)
Alkaline Phosphatase: 100 [IU]/L (ref 44–121)
BUN/Creatinine Ratio: 18 (ref 12–28)
BUN: 14 mg/dL (ref 8–27)
Bilirubin Total: 0.6 mg/dL (ref 0.0–1.2)
CO2: 26 mmol/L (ref 20–29)
Calcium: 9.8 mg/dL (ref 8.7–10.3)
Chloride: 101 mmol/L (ref 96–106)
Creatinine, Ser: 0.79 mg/dL (ref 0.57–1.00)
Globulin, Total: 2.5 g/dL (ref 1.5–4.5)
Glucose: 108 mg/dL — ABNORMAL HIGH (ref 70–99)
Potassium: 5 mmol/L (ref 3.5–5.2)
Sodium: 142 mmol/L (ref 134–144)
Total Protein: 7.3 g/dL (ref 6.0–8.5)
eGFR: 83 mL/min/{1.73_m2} (ref >59)

## 2023-06-09 LAB — HEMOGLOBIN A1C
Est. average glucose Bld gHb Est-mCnc: 117 mg/dL
Hgb A1c MFr Bld: 5.7 % — ABNORMAL HIGH (ref 4.8–5.6)

## 2023-06-09 NOTE — Assessment & Plan Note (Signed)
Counseled patient on smoking cessation.  Will go ahead with bupropion medication-assisted option for cessation, prescribed as noted below.

## 2023-06-09 NOTE — Assessment & Plan Note (Signed)
History of hyperglycemia.  Will recheck A1c today.

## 2023-06-09 NOTE — Assessment & Plan Note (Signed)
Patient's symptoms have been improved using albuterol inhaler.  Will refill as noted today.  Advised patient to let us know if her symptoms start to become more frequent and/or worse and we will consider a pulmonary function test at that point.

## 2023-06-09 NOTE — Assessment & Plan Note (Signed)
No acute concerns.  Continue to monitor. Will check lipid panel and CMP today.

## 2023-06-09 NOTE — Assessment & Plan Note (Signed)
Patient would like to consider Prolia.  Will go ahead and refer her to endocrinology to discuss this versus other options, as well as their benefits versus side effects.  She may be considering dental surgery within the coming year or two.

## 2023-06-12 DIAGNOSIS — T8484XA Pain due to internal orthopedic prosthetic devices, implants and grafts, initial encounter: Secondary | ICD-10-CM | POA: Diagnosis not present

## 2023-06-12 DIAGNOSIS — S82851S Displaced trimalleolar fracture of right lower leg, sequela: Secondary | ICD-10-CM | POA: Diagnosis not present

## 2023-06-12 DIAGNOSIS — M25571 Pain in right ankle and joints of right foot: Secondary | ICD-10-CM | POA: Diagnosis not present

## 2023-07-02 ENCOUNTER — Other Ambulatory Visit: Payer: Self-pay | Admitting: Family Medicine

## 2023-07-02 DIAGNOSIS — Z716 Tobacco abuse counseling: Secondary | ICD-10-CM

## 2023-07-11 DIAGNOSIS — M810 Age-related osteoporosis without current pathological fracture: Secondary | ICD-10-CM | POA: Diagnosis not present

## 2023-07-11 DIAGNOSIS — Z720 Tobacco use: Secondary | ICD-10-CM | POA: Diagnosis not present

## 2023-08-02 DIAGNOSIS — M810 Age-related osteoporosis without current pathological fracture: Secondary | ICD-10-CM | POA: Diagnosis not present

## 2023-08-09 IMAGING — MG MM DIGITAL SCREENING BILAT W/ TOMO AND CAD
8 series · 8 of 24 positions shown · non-contrast
Comparison: Previous exam(s).

CLINICAL DATA: Screening.

EXAM:
DIGITAL SCREENING BILATERAL MAMMOGRAM WITH TOMOSYNTHESIS AND CAD
TECHNIQUE: Bilateral screening digital craniocaudal and mediolateral oblique
mammograms were obtained. Bilateral screening digital breast
tomosynthesis was performed. The images were evaluated with
computer-aided detection.

[R MLO synth-2D]
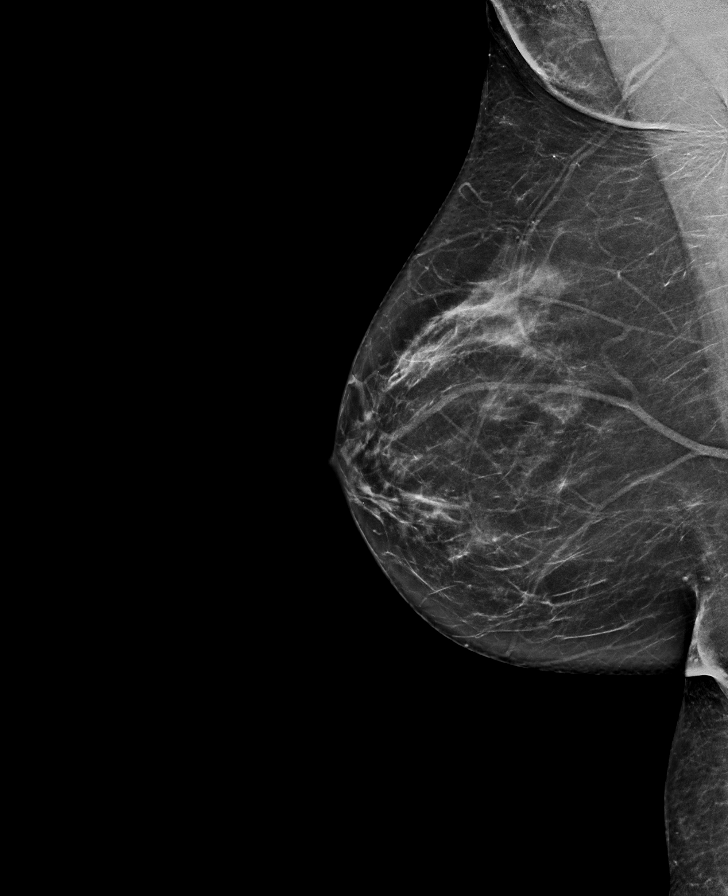

[L MLO synth-2D]
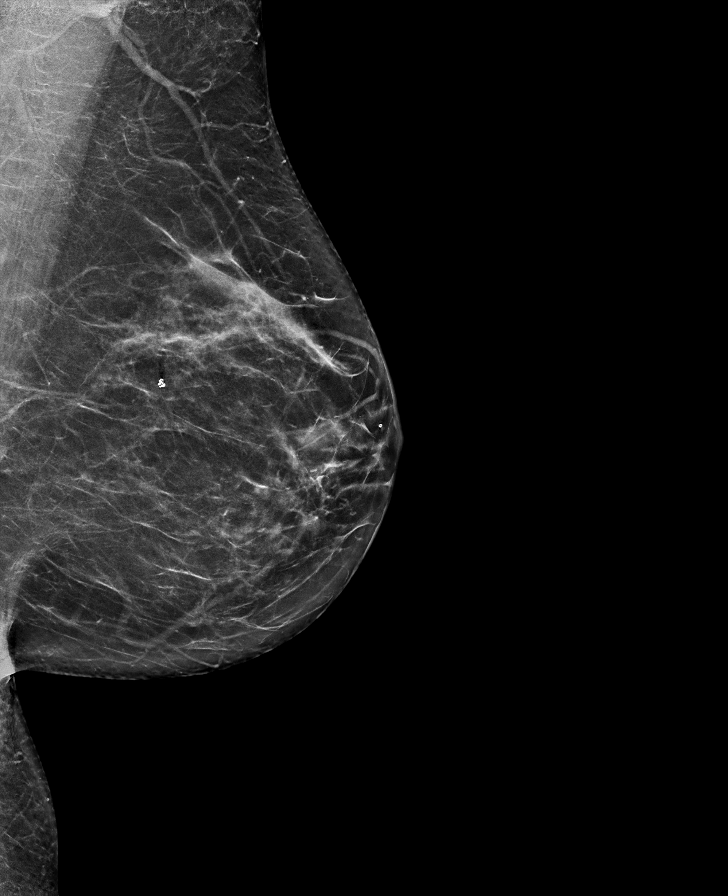

[L CC synth-2D]
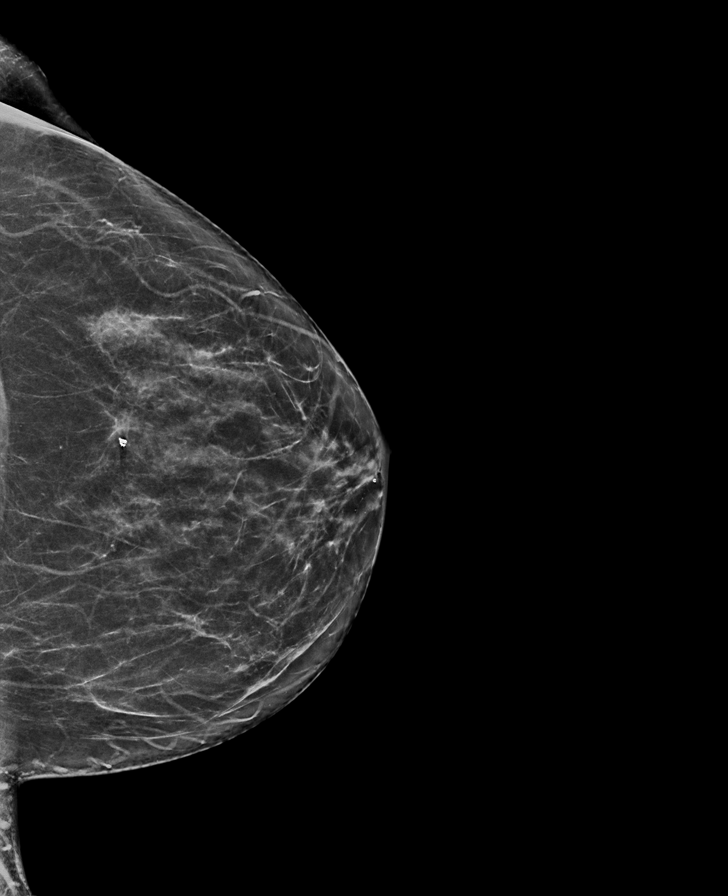

[R CC synth-2D]
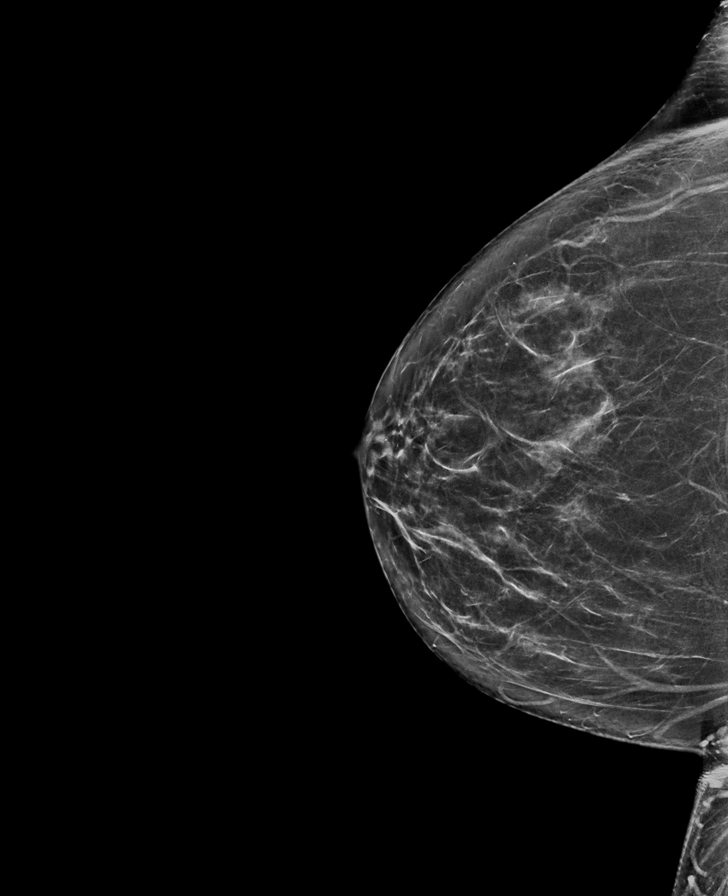

[L CC tomo · tomo slice 35/70.0]
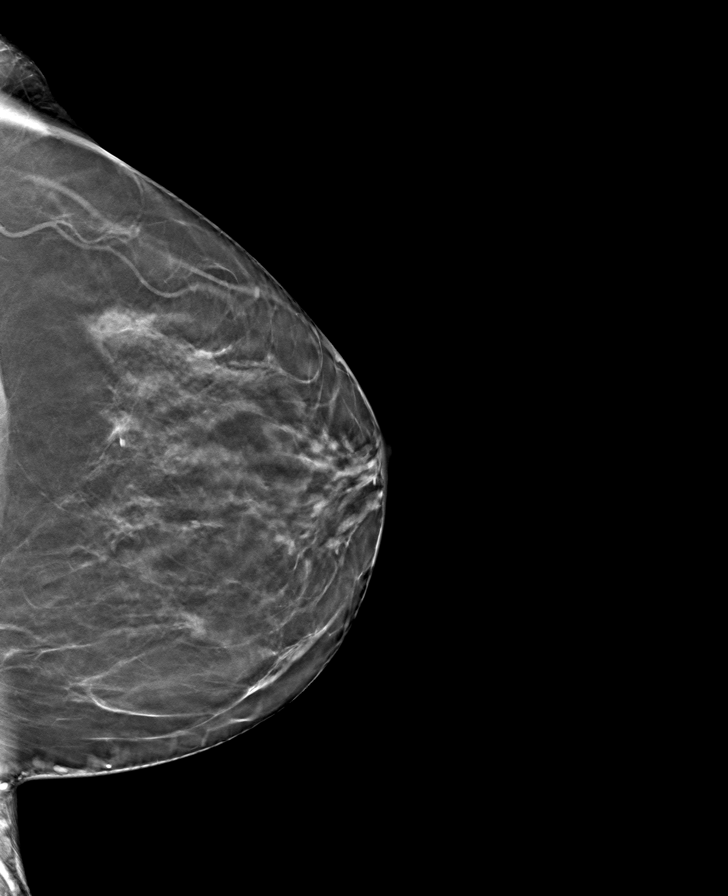

[L MLO tomo · tomo slice 38/75.0]
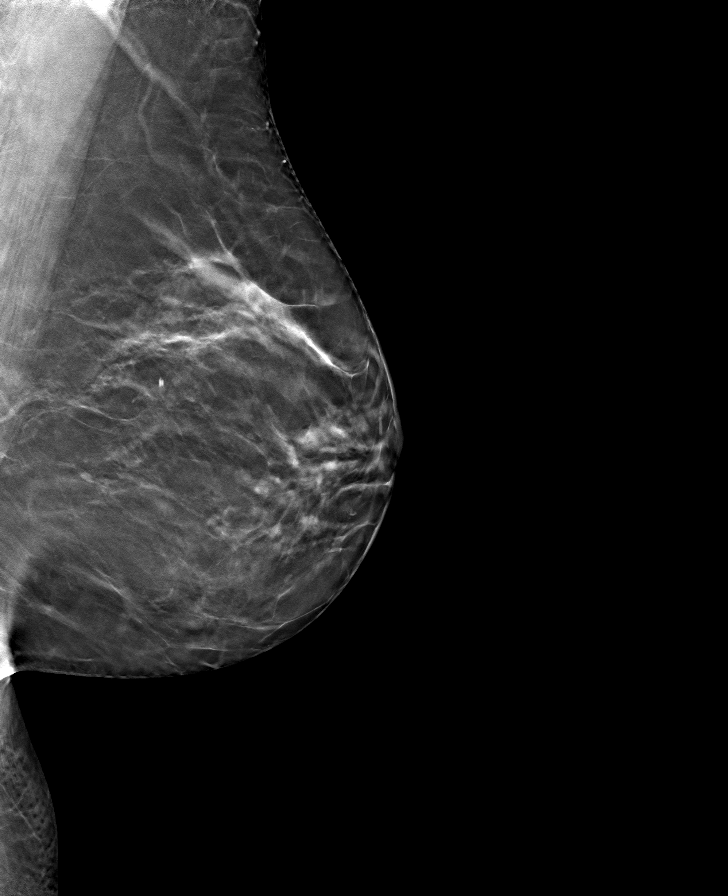

[R MLO tomo · tomo slice 37/72.0]
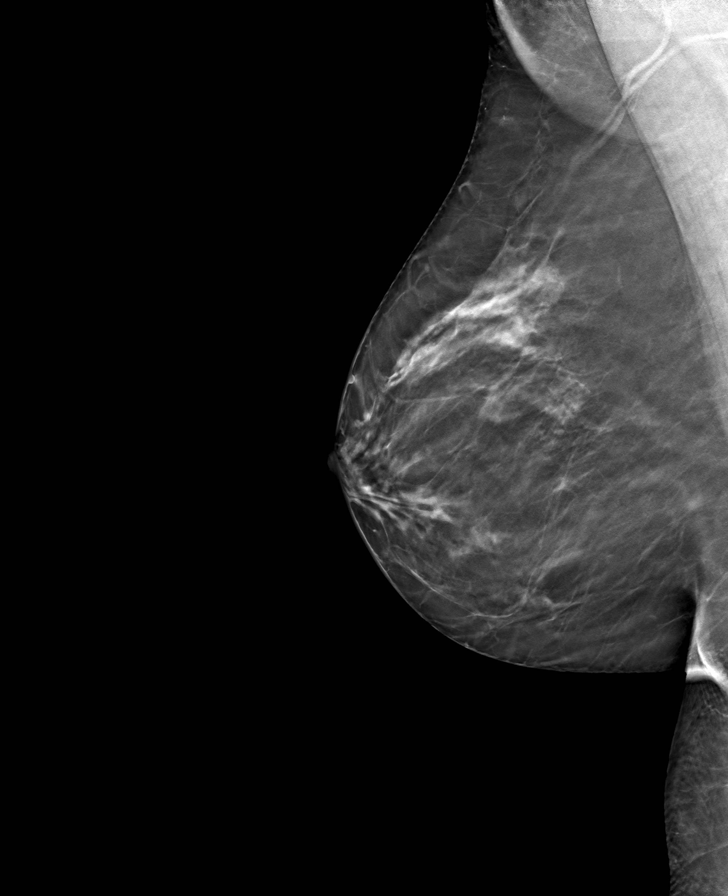

[R CC tomo · tomo slice 35/69.0]
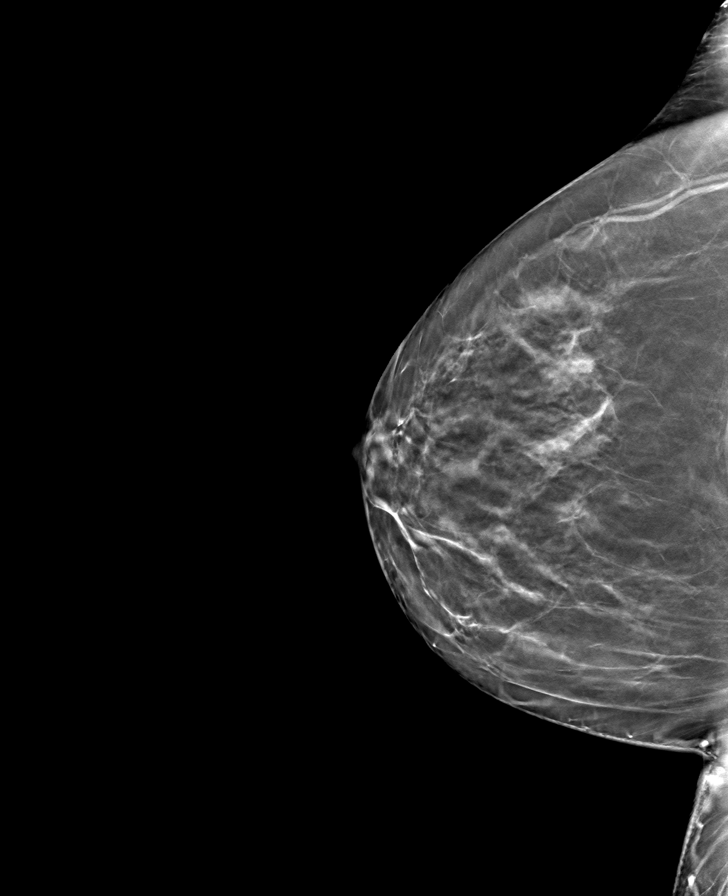

[8 of 24 positions shown; findings below may reference images not displayed]

ACR Breast Density Category b: There are scattered areas of
fibroglandular density.
FINDINGS: There are no findings suspicious for malignancy.
IMPRESSION: No mammographic evidence of malignancy. A result letter of this
screening mammogram will be mailed directly to the patient.

RECOMMENDATION:
Screening mammogram in one year. (Code:51-O-LD2)

BI-RADS CATEGORY  1: Negative.

## 2023-08-29 ENCOUNTER — Other Ambulatory Visit: Payer: Self-pay | Admitting: Family Medicine

## 2023-08-29 DIAGNOSIS — R0609 Other forms of dyspnea: Secondary | ICD-10-CM

## 2023-10-02 ENCOUNTER — Other Ambulatory Visit: Payer: Self-pay | Admitting: Family Medicine

## 2023-10-02 DIAGNOSIS — Z716 Tobacco abuse counseling: Secondary | ICD-10-CM

## 2023-10-03 NOTE — Telephone Encounter (Signed)
 OV 06/07/24- up to date with upcoming appointment Requested Prescriptions  Pending Prescriptions Disp Refills   buPROPion  (WELLBUTRIN  SR) 150 MG 12 hr tablet [Pharmacy Med Name: BUPROPION  HCL SR 150 MG TABLET] 180 tablet 0    Sig: TAKE 1 TABLET TWICE DAILY, WITH AT LEAST 8 HOURS BETWEEN DOSES     Psychiatry: Antidepressants - bupropion  Failed - 10/03/2023 12:52 PM      Failed - Valid encounter within last 6 months    Recent Outpatient Visits           3 months ago Encounter for initial annual wellness visit (AWV) in Medicare patient   [facility] Kramer, Lauraine SAILOR, DO   8 months ago GAD (generalized anxiety disorder)   [facility] Cyndi Shaver, PA-C   1 year ago Appointment canceled by hospital   [facility] Cyndi Shaver, PA-C   1 year ago Annual physical exam   [facility] Cyndi Shaver, PA-C   1 year ago Panic attack as reaction to stress   [facility] Cyndi Shaver, PA-C       Future Appointments             In 2 months Pardue, Lauraine SAILOR, DO [facility]            Passed - Cr in normal range and within 360 days    Creatinine, Ser  Date Value Ref Range Status  06/08/2023 0.79 0.57 - 1.00 mg/dL Final         Passed - AST in normal range and within 360 days    AST  Date Value Ref Range Status  06/08/2023 25 0 - 40 IU/L Final         Passed - ALT in normal range and within 360 days    ALT  Date Value Ref Range Status  06/08/2023 18 0 - 32 IU/L Final         Passed - Last BP in normal range    BP Readings from Last 1 Encounters:  06/08/23 116/74

## 2023-10-10 ENCOUNTER — Other Ambulatory Visit: Payer: Self-pay | Admitting: Physician Assistant

## 2023-10-10 DIAGNOSIS — F410 Panic disorder [episodic paroxysmal anxiety] without agoraphobia: Secondary | ICD-10-CM

## 2023-12-08 ENCOUNTER — Ambulatory Visit (INDEPENDENT_AMBULATORY_CARE_PROVIDER_SITE_OTHER): Payer: Self-pay | Admitting: Family Medicine

## 2023-12-08 ENCOUNTER — Encounter: Payer: Self-pay | Admitting: Family Medicine

## 2023-12-08 VITALS — BP 147/85 | HR 108 | Ht 63.0 in | Wt 146.8 lb

## 2023-12-08 DIAGNOSIS — R030 Elevated blood-pressure reading, without diagnosis of hypertension: Secondary | ICD-10-CM

## 2023-12-08 DIAGNOSIS — F17211 Nicotine dependence, cigarettes, in remission: Secondary | ICD-10-CM | POA: Diagnosis not present

## 2023-12-08 DIAGNOSIS — Z4889 Encounter for other specified surgical aftercare: Secondary | ICD-10-CM | POA: Diagnosis not present

## 2023-12-08 DIAGNOSIS — Z1231 Encounter for screening mammogram for malignant neoplasm of breast: Secondary | ICD-10-CM

## 2023-12-08 DIAGNOSIS — M810 Age-related osteoporosis without current pathological fracture: Secondary | ICD-10-CM

## 2023-12-08 DIAGNOSIS — E7849 Other hyperlipidemia: Secondary | ICD-10-CM

## 2023-12-08 MED ORDER — ATORVASTATIN CALCIUM 20 MG PO TABS: 20.0000 mg | ORAL_TABLET | Freq: Every day | ORAL | 3 refills | Status: AC

## 2023-12-08 NOTE — Progress Notes (Signed)
 Established patient visit   Patient: Terri Weaver   DOB: [birth year]   68 y.o. Female  MRN: [id number] Visit Date: 12/08/2023  Today's healthcare provider: Sherlyn Hay, DO   Chief Complaint  Patient presents with   Follow-up   Subjective    HPI Terri Weaver "[name]" is a 68 year old female who presents with concerns about post-operative care following laser liposuction.  She underwent laser liposuction in [place] and is concerned about the appearance of her incision. She has been trying to communicate with the surgical team by sending pictures but has not received adequate guidance. She was advised to perform a wet to dry dressing change but did not receive a follow-up call to assist her with the process. She is scheduled for a one-month post-operative follow-up on December 19, 2023. The incision extends from hip to hip, with a scab forming to the right of midline and some granulation tissue underneath the wet to dry dressings (approx. 2 inches lateral to midline on both sides). No significant pain, worsening redness, or fever. She has been changing the dressings twice daily as instructed. She wears a compression garment continuously, removing it only for an hour daily to shower and wash the garment.  She denies any associated fevers or chills.  She has a history of smoking but has recently quit (09/30/2023), aided by Wellbutrin, which she started in February 2025. No adverse reactions to Wellbutrin, unlike her previous experience with Chantix. She is motivated to maintain smoking cessation, especially with upcoming personal events.  She is receiving Prolia injections for osteoporosis, with the next injection scheduled for June. She experienced back and hip pain after the first injection, which is a known side effect. She is concerned about it being a side effect.  She has hyperlipidemia and was previously on atorvastatin, but her prescription ran out and was not refilled. She  has stopped smoking and hopes this will improve her condition. She also uses albuterol occasionally, particularly during physical activity, but reports reduced need since quitting smoking.  She is due for a mammogram and has been considering vaccines for shingles and pneumonia but has postponed them due to her current post-operative status. Her daughter is getting married on Jan 04, 2024.  She reports that her home blood pressures have been in the 130s/70s.      Medications: Outpatient Medications Prior to Visit  Medication Sig   albuterol (VENTOLIN HFA) 108 (90 Base) MCG/ACT inhaler TAKE 2 PUFFS BY MOUTH EVERY 6 HOURS AS NEEDED FOR WHEEZE OR SHORTNESS OF BREATH   BIOTIN PO Take 1 tablet by mouth daily.    buPROPion (WELLBUTRIN SR) 150 MG 12 hr tablet TAKE 1 TABLET TWICE DAILY, WITH AT LEAST 8 HOURS BETWEEN DOSES   Calcium-Vitamin D-Vitamin K 500-100-40 MG-UNT-MCG CHEW Chew 1 tablet by mouth daily.    COLLAGEN PO Take 1 tablet by mouth daily.    escitalopram (LEXAPRO) 10 MG tablet TAKE 1 TABLET BY MOUTH EVERY DAY   KRILL OIL PO Take by mouth.   METAMUCIL FIBER PO Take by mouth.   OVER THE COUNTER MEDICATION Take 1 tablet by mouth daily. Preservision Areds 2: Take 2 tablets daily by mouth   [DISCONTINUED] atorvastatin (LIPITOR) 20 MG tablet Take 1 tablet (20 mg total) by mouth daily.   [DISCONTINUED] alendronate (FOSAMAX) 70 MG tablet Take 1 tablet (70 mg total) by mouth every 7 (seven) days. Take with a full glass of water on an empty stomach.  No facility-administered medications prior to visit.        Objective    BP (!) 147/85   Pulse (!) 108   Ht 5\' 3"  (1.6 m)   Wt 146 lb 12.8 oz (66.6 kg)   SpO2 100%   BMI 26.00 kg/m     Physical Exam Vitals and nursing note reviewed.  Constitutional:      General: She is not in acute distress.    Appearance: Normal appearance.  HENT:     Head: Normocephalic and atraumatic.  Eyes:     General: No scleral icterus.     Conjunctiva/sclera: Conjunctivae normal.  Cardiovascular:     Rate and Rhythm: Normal rate.  Pulmonary:     Effort: Pulmonary effort is normal.  Skin:         Comments: Surgical wound as noted, feeling well overall.  Patient has 2 areas with some granulation tissue (green on diagram), that were covered by the wet-to-dry dressings, and 1 area with a scab (blue on diagram).  These areas have some mild redness at the immediate wound edges but show no signs concerning for infection and appear to be healing well  Neurological:     Mental Status: She is alert and oriented to person, place, and time. Mental status is at baseline.  Psychiatric:        Mood and Affect: Mood normal.        Behavior: Behavior normal.       No results found for any visits on 12/08/23.  Assessment & Plan    Encounter for screening mammogram for breast cancer -     3D Screening Mammogram, Left and Right; Future  Other hyperlipidemia -     Atorvastatin Calcium; Take 1 tablet (20 mg total) by mouth daily.  Dispense: 90 tablet; Refill: 3  Encounter for postoperative wound check  Cigarette nicotine dependence in remission  Age-related osteoporosis without current pathological fracture  Elevated blood pressure reading in office without diagnosis of hypertension   Postoperative wound care Recent operation involving laser liposuction and removal of skin flap through [place]. Incision healing with granulation tissue, no signs of infection. Adhering to wound care instructions. - Continue wet to dry dressings twice daily. - Wear compression garment as instructed. - Follow up with surgical team on April 22.  Smoking cessation Successfully quit smoking with Wellbutrin, no adverse reactions. Motivated to maintain cessation. - Continue Wellbutrin. - Encourage continued abstinence from smoking.  Hyperlipidemia Previously on atorvastatin, prescription needed. Smoking cessation may improve lipid levels. -  Prescribe atorvastatin. - Monitor lipid levels at next follow-up.  Elevated blood pressure reading without diagnosis of hypertension Patient's blood pressure was mildly elevated during the encounter today.  However, her home blood pressures are better controlled, in the 130s/70s.  As patient is agitated and concerned about her postoperative wound today, will not start medication based on the current reading. - Recheck blood pressure at next visit.  Osteoporosis Receiving Prolia injections, experienced back and hip pain post-injection, monitoring planned. - Administer second Prolia injection in June. - Monitor for back and hip pain post-injection.  General Health Maintenance Due for mammogram, shingles, and pneumonia vaccines. Delaying vaccines until after wound healing. Lifestyle changes ongoing. - Order mammogram. - Discuss shingles and pneumonia vaccines again at next visit. - Encourage lifestyle modifications for weight management and overall health.   Return in about 6 months (around 06/08/2024) for CPE and for mAWV with AWV nurse.      I discussed the  assessment and treatment plan with the patient  The patient was provided an opportunity to ask questions and all were answered. The patient agreed with the plan and demonstrated an understanding of the instructions.   The patient was advised to call back or seek an in-person evaluation if the symptoms worsen or if the condition fails to improve as anticipated.    Sherlyn Hay, DO  [facility] [phone] (phone) [phone] (fax)  [facility]

## 2023-12-29 ENCOUNTER — Other Ambulatory Visit: Payer: Self-pay | Admitting: Family Medicine

## 2023-12-29 DIAGNOSIS — Z716 Tobacco abuse counseling: Secondary | ICD-10-CM

## 2024-03-28 ENCOUNTER — Other Ambulatory Visit: Payer: Self-pay | Admitting: Family Medicine

## 2024-03-28 DIAGNOSIS — Z716 Tobacco abuse counseling: Secondary | ICD-10-CM

## 2024-04-15 ENCOUNTER — Encounter

## 2024-05-08 ENCOUNTER — Ambulatory Visit
Admission: RE | Admit: 2024-05-08 | Discharge: 2024-05-08 | Disposition: A | Discharge status: Home / Self Care | Source: Ambulatory Visit | Attending: Family Medicine | Admitting: Family Medicine

## 2024-05-08 DIAGNOSIS — Z1231 Encounter for screening mammogram for malignant neoplasm of breast: Secondary | ICD-10-CM | POA: Diagnosis present

## 2024-05-13 ENCOUNTER — Ambulatory Visit: Payer: Self-pay | Admitting: Family Medicine

## 2024-06-27 ENCOUNTER — Other Ambulatory Visit: Payer: Self-pay | Admitting: Family Medicine

## 2024-06-27 DIAGNOSIS — Z716 Tobacco abuse counseling: Secondary | ICD-10-CM

## 2024-07-03 ENCOUNTER — Ambulatory Visit

## 2024-07-03 ENCOUNTER — Encounter: Payer: Self-pay | Admitting: Family Medicine

## 2024-07-03 ENCOUNTER — Ambulatory Visit (INDEPENDENT_AMBULATORY_CARE_PROVIDER_SITE_OTHER): Admitting: Family Medicine

## 2024-07-03 ENCOUNTER — Ambulatory Visit: Admitting: Emergency Medicine

## 2024-07-03 VITALS — BP 119/79 | HR 103 | Temp 98.6°F | Ht 62.5 in | Wt 144.3 lb

## 2024-07-03 VITALS — Ht 62.5 in | Wt 141.0 lb

## 2024-07-03 DIAGNOSIS — E7849 Other hyperlipidemia: Secondary | ICD-10-CM

## 2024-07-03 DIAGNOSIS — Z0000 Encounter for general adult medical examination without abnormal findings: Secondary | ICD-10-CM | POA: Diagnosis not present

## 2024-07-03 DIAGNOSIS — N182 Chronic kidney disease, stage 2 (mild): Secondary | ICD-10-CM | POA: Insufficient documentation

## 2024-07-03 DIAGNOSIS — Z716 Tobacco abuse counseling: Secondary | ICD-10-CM

## 2024-07-03 DIAGNOSIS — R7303 Prediabetes: Secondary | ICD-10-CM

## 2024-07-03 DIAGNOSIS — R0609 Other forms of dyspnea: Secondary | ICD-10-CM

## 2024-07-03 DIAGNOSIS — F17211 Nicotine dependence, cigarettes, in remission: Secondary | ICD-10-CM

## 2024-07-03 DIAGNOSIS — Z5320 Procedure and treatment not carried out because of patient's decision for unspecified reasons: Secondary | ICD-10-CM | POA: Diagnosis not present

## 2024-07-03 DIAGNOSIS — M810 Age-related osteoporosis without current pathological fracture: Secondary | ICD-10-CM

## 2024-07-03 MED ORDER — ALBUTEROL SULFATE HFA 108 (90 BASE) MCG/ACT IN AERS: 2.0000 | INHALATION_SPRAY | Freq: Four times a day (QID) | RESPIRATORY_TRACT | 3 refills | Status: AC | PRN

## 2024-07-03 MED ORDER — BUPROPION HCL ER (XL) 150 MG PO TB24
150.0000 mg | ORAL_TABLET | Freq: Every day | ORAL | 2 refills | Status: AC
Start: 2024-07-03 — End: ?

## 2024-07-03 NOTE — Patient Instructions (Addendum)
 Terri Weaver,  Thank you for taking the time for your Medicare Wellness Visit. I appreciate your continued commitment to your health goals. Please review the care plan we discussed, and feel free to reach out if I can assist you further.  Please note that Annual Wellness Visits do not include a physical exam. Some assessments may be limited, especially if the visit was conducted virtually. If needed, we may recommend an in-person follow-up with your provider.  Ongoing Care Seeing your primary care provider every 3 to 6 months helps us  monitor your health and provide consistent, personalized care.   Referrals If a referral was made during today's visit and you haven't received any updates within two weeks, please contact the referred provider directly to check on the status.  Recommended Screenings:  Health Maintenance  Topic Date Due   Zoster (Shingles) Vaccine (1 of 2) Never done   Screening for Lung Cancer  Never done   COVID-19 Vaccine (5 - 2025-26 season) 04/29/2024   Medicare Annual Wellness Visit  06/07/2024   Pneumococcal Vaccine for age over 68 (1 of 1 - PCV) 04/29/2025*   DEXA scan (bone density measurement)  04/10/2025   Breast Cancer Screening  05/08/2025   Colon Cancer Screening  12/25/2026   DTaP/Tdap/Td vaccine (3 - Td or Tdap) 02/05/2030   Flu Shot  Completed   Hepatitis C Screening  Completed   Meningitis B Vaccine  Aged Out  *Topic was postponed. The date shown is not the original due date.       07/03/2024    1:17 PM  Advanced Directives  Does Patient Have a Medical Advance Directive? No  Would patient like information on creating a medical advance directive? Yes (MAU/Ambulatory/Procedural Areas - Information given)   Information on Advanced Care Planning can be found at [place]  Secret[facility] Advance Health Care Directives Advance Health Care Directives ([email or web address].[email or web address]/)  You may also get the forms at your doctor's office.  Vision: Annual vision screenings  are recommended for early detection of glaucoma, cataracts, and diabetic retinopathy. These exams can also reveal signs of chronic conditions such as diabetes and high blood pressure.  Dental: Annual dental screenings help detect early signs of oral cancer, gum disease, and other conditions linked to overall health, including heart disease and diabetes.  Please see the attached documents for additional preventive care recommendations.   Fall Prevention in the Home, Adult Falls can cause injuries and affect people of all ages. There are many simple things that you can do to make your home safe and to help prevent falls. If you need it, ask for help making these changes. What actions can I take to prevent falls? General information Use good lighting in all rooms. Make sure to: Replace any light bulbs that burn out. Turn on lights if it is dark and use night-lights. Keep items that you use often in easy-to-reach places. Lower the shelves around your home if needed. Move furniture so that there are clear paths around it. Do not keep throw rugs or other things on the floor that can make you trip. If any of your floors are uneven, fix them. Add color or contrast paint or tape to clearly mark and help you see: Grab bars or handrails. First and last steps of staircases. Where the edge of each step is. If you use a ladder or stepladder: Make sure that it is fully opened. Do not climb a closed ladder. Make sure the sides of the ladder are  locked in place. Have someone hold the ladder while you use it. Know where your pets are as you move through your home. What can I do in the bathroom?     Keep the floor dry. Clean up any water that is on the floor right away. Remove soap buildup in the bathtub or shower. Buildup makes bathtubs and showers slippery. Use non-skid mats or decals on the floor of the bathtub or shower. Attach bath mats securely with double-sided, non-slip rug tape. If you need  to sit down while you are in the shower, use a non-slip stool. Install grab bars by the toilet and in the bathtub and shower. Do not use towel bars as grab bars. What can I do in the bedroom? Make sure that you have a light by your bed that is easy to reach. Do not use any sheets or blankets on your bed that hang to the floor. Have a firm bench or chair with side arms that you can use for support when you get dressed. What can I do in the kitchen? Clean up any spills right away. If you need to reach something above you, use a sturdy step stool that has a grab bar. Keep electrical cables out of the way. Do not use floor polish or wax that makes floors slippery. What can I do with my stairs? Do not leave anything on the stairs. Make sure that you have a light switch at the top and the bottom of the stairs. Have them installed if you do not have them. Make sure that there are handrails on both sides of the stairs. Fix handrails that are broken or loose. Make sure that handrails are as long as the staircases. Install non-slip stair treads on all stairs in your home if they do not have carpet. Avoid having throw rugs at the top or bottom of stairs, or secure the rugs with carpet tape to prevent them from moving. Choose a carpet design that does not hide the edge of steps on the stairs. Make sure that carpet is firmly attached to the stairs. Fix any carpet that is loose or worn. What can I do on the outside of my home? Use bright outdoor lighting. Repair the edges of walkways and driveways and fix any cracks. Clear paths of anything that can make you trip, such as tools or rocks. Add color or contrast paint or tape to clearly mark and help you see high doorway thresholds. Trim any bushes or trees on the main path into your home. Check that handrails are securely fastened and in good repair. Both sides of all steps should have handrails. Install guardrails along the edges of any raised decks or  porches. Have leaves, snow, and ice cleared regularly. Use sand, salt, or ice melt on walkways during winter months if you live where there is ice and snow. In the garage, clean up any spills right away, including grease or oil spills. What other actions can I take? Review your medicines with your health care provider. Some medicines can make you confused or feel dizzy. This can increase your chance of falling. Wear closed-toe shoes that fit well and support your feet. Wear shoes that have rubber soles and low heels. Use a cane, walker, scooter, or crutches that help you move around if needed. Talk with your provider about other ways that you can decrease your risk of falls. This may include seeing a physical therapist to learn to do exercises to improve movement  and strength. Where to find more information Centers for Disease Control and Prevention, [employer]: tonerpromos.no [employer] on Aging: baseringtones.pl National Institute on Aging: baseringtones.pl Contact a health care provider if: You are afraid of falling at home. You feel weak, drowsy, or dizzy at home. You fall at home. Get help right away if you: Lose consciousness or have trouble moving after a fall. Have a fall that causes a head injury. These symptoms may be an emergency. Get help right away. Call 911. Do not wait to see if the symptoms will go away. Do not drive yourself to the hospital. This information is not intended to replace advice given to you by your health care provider. Make sure you discuss any questions you have with your health care provider. Document Revised: 04/18/2022 Document Reviewed: 04/18/2022 Elsevier Patient Education  2024 [employer].

## 2024-07-03 NOTE — Progress Notes (Signed)
 Complete physical exam   Patient: Terri Weaver   DOB: 1955/12/24   68 y.o. Female  MRN: [id number] Visit Date: 07/03/2024  Today's healthcare provider: LAURAINE LOISE BUOY, DO   Chief Complaint  Patient presents with   Annual Exam    Diet- General Exercise- Try to a couple of times a week, constantly walking Overall feeling- Pretty good Sleep- Good Concerns- None     Subjective    Terri Weaver is a 68 y.o. female who presents today for a complete physical exam.   HPI HPI     Annual Exam    Additional comments: Diet- General Exercise- Try to a couple of times a week, constantly walking Overall feeling- Pretty good Sleep- Good Concerns- None        Last edited by Terrel [place] CROME, CMA on 07/03/2024  2:01 PM.       Terri Weaver is a 68 year old female who presents for an annual physical exam.  She has been focusing on increasing her physical activity by walking frequently and has been traveling extensively to various locations including [place] , [place] , [place]  [address], [address], and [place].  Approximately two and a half months ago, she experienced a nagging headache for a couple of days, followed by body aches and a low-grade fever that escalated to 101F. She tested negative for COVID-19 and flu, and the fever resolved after two to three days without further issues.  She has received her COVID-19 booster, flu shot, and first shingles vaccine dose on October 29. Initially unsure of the brand of her COVID-19 booster, she later confirmed it was Moderna.  She quit smoking in February but has had a few slips. She uses albuterol  infrequently, typically two puffs as needed, and requests a refill as her last prescription was from December of the previous year.  She has difficulty taking her Wellbutrin  consistently, particularly the last dose of the day, and mentions taking both doses in the morning with her other medications. She is  currently on rosuvastatin and Lexapro , with no issues reported.  She experiences stiffness in her ankle with a plate and screws, particularly in cold weather, and numbness around the scar area. She is taking calcium  with vitamin D  and is scheduled for labs related to her osteoporosis management.  She has a history of elevated hemoglobin A1c and is trying to manage her diet by avoiding simple sugars.     Past Medical History:  Diagnosis Date   Allergy    Pcn   Anxiety 2 1/2 yrs ago   Hyperlipidemia    8 months ago   Tuberculosis 1993   Past Surgical History:  Procedure Laterality Date   ABDOMINAL HYSTERECTOMY     COLONOSCOPY WITH PROPOFOL  N/A 04/26/2018   Procedure: COLONOSCOPY WITH PROPOFOL ;  Surgeon: Unk Corinn Skiff, MD;  Location: [facility] ENDOSCOPY;  Service: Gastroenterology;  Laterality: N/A;   COLONOSCOPY WITH PROPOFOL  N/A 12/24/2021   Procedure: COLONOSCOPY WITH PROPOFOL ;  Surgeon: Unk Corinn Skiff, MD;  Location: [facility] ENDOSCOPY;  Service: Gastroenterology;  Laterality: N/A;   COSMETIC SURGERY  11/13/2023   Laser lipo and excess skin removal   FRACTURE SURGERY  07/2018   Ankle fx   ORIF ANKLE FRACTURE Right 07/31/2018   Procedure: OPEN REDUCTION INTERNAL FIXATION (ORIF) ANKLE FRACTURE;  Surgeon: Edie Norleen PARAS, MD;  Location: [facility] ORS;  Service: Orthopedics;  Laterality: Right;   TONSILLECTOMY     Social History   Socioeconomic History  Marital status: Married    Spouse name: Tod   Number of children: 1   Years of education: Not on file   Highest education level: Associate degree: occupational, scientist, product/process development, or vocational program  Occupational History   Occupation: retired  Tobacco Use   Smoking status: Former    Current packs/day: 0.00    Average packs/day: 0.5 packs/day for 47.1 years (23.6 ttl pk-yrs)    Types: Cigarettes    Start date: 66    Quit date: 10/17/2023    Years since quitting: 0.7   Smokeless tobacco: Never   Tobacco comments:    Cut down  from 1 pack daily to 1/2 pack a week  Vaping Use   Vaping status: Never Used  Substance and Sexual Activity   Alcohol use: Yes    Comment: Do not drink weekly   Drug use: No   Sexual activity: Yes    Partners: Male    Birth control/protection: Post-menopausal, Other-see comments    Comment: Had hysterectomy  Other Topics Concern   Not on file  Social History Narrative   Not on file   Social Drivers of Health   Financial Resource Strain: Low Risk  (06/29/2024)   Overall Financial Resource Strain (CARDIA)    Difficulty of Paying Living Expenses: Not hard at all  Food Insecurity: No Food Insecurity (07/03/2024)   Hunger Vital Sign    Worried About Running Out of Food in the Last Year: Never true    Ran Out of Food in the Last Year: Never true  Transportation Needs: No Transportation Needs (07/03/2024)   PRAPARE - Administrator, Civil Service (Medical): No    Lack of Transportation (Non-Medical): No  Physical Activity: Insufficiently Active (07/03/2024)   Exercise Vital Sign    Days of Exercise per Week: 2 days    Minutes of Exercise per Session: 20 min  Stress: No Stress Concern Present (07/03/2024)   [employer] of Occupational Health - Occupational Stress Questionnaire    Feeling of Stress: Not at all  Social Connections: Socially Integrated (07/03/2024)   Social Connection and Isolation Panel    Frequency of Communication with Friends and Family: More than three times a week    Frequency of Social Gatherings with Friends and Family: Once a week    Attends Religious Services: More than 4 times per year    Active Member of [employer] or Organizations: Yes    Attends Banker Meetings: More than 4 times per year    Marital Status: Married  Catering Manager Violence: Not At Risk (07/03/2024)   Humiliation, Afraid, Rape, and Kick questionnaire    Fear of Current or Ex-Partner: No    Emotionally Abused: No    Physically Abused: No    Sexually Abused: No    Family Status  Relation Name Status   Mother Clarita Deceased   Father Darold Deceased   Sister Sherri Alive   Mat Aunt  (Not Specified)  No partnership data on file   Family History  Problem Relation Age of Onset   Stomach cancer Mother    Cancer Mother    Heart disease Mother    Prostate cancer Father    COPD Father    Heart attack Sister    Heart disease Sister    Hypertension Sister    Breast cancer Maternal Aunt    Allergies  Allergen Reactions   Chantix  [Varenicline] Itching   Penicillins     Patient Care Team:  Donzella Lauraine SAILOR, DO as PCP - General (Family Medicine) Mittie Gaskin, MD as Referring Physician (Ophthalmology) Brendia Calton Squires, [place] (Endocrinology)   Medications: Outpatient Medications Prior to Visit  Medication Sig Note   atorvastatin  (LIPITOR) 20 MG tablet Take 1 tablet (20 mg total) by mouth daily.    BIOTIN  PO Take 1 tablet by mouth daily.     Calcium -Vitamin D -Vitamin K  500-100-40 MG-UNT-MCG CHEW Chew 1 tablet by mouth daily.     COLLAGEN PO Take 1 tablet by mouth daily.  (Patient taking differently: Take 1 tablet by mouth daily. Taking twice a day)    denosumab  (PROLIA ) 60 MG/ML SOSY injection Inject 60 mg into the skin every 6 (six) months.    denosumab  (PROLIA ) 60 MG/ML SOSY injection Inject 60 mg into the skin every 6 (six) months.    escitalopram  (LEXAPRO ) 10 MG tablet TAKE 1 TABLET BY MOUTH EVERY DAY    KRILL OIL PO Take by mouth.    METAMUCIL FIBER PO Take by mouth.    OVER THE COUNTER MEDICATION Take 1 tablet by mouth daily. Preservision Areds 2: Take 2 tablets daily by mouth    [DISCONTINUED] albuterol  (VENTOLIN  HFA) 108 (90 Base) MCG/ACT inhaler TAKE 2 PUFFS BY MOUTH EVERY 6 HOURS AS NEEDED FOR WHEEZE OR SHORTNESS OF BREATH    [DISCONTINUED] buPROPion  (WELLBUTRIN  SR) 150 MG 12 hr tablet TAKE 1 TABLET TWICE DAILY, WITH AT LEAST 8 HOURS BETWEEN DOSES 07/03/2024: struggles to remember 2nd dose   No  facility-administered medications prior to visit.    Review of Systems  Constitutional:  Negative for chills, fatigue and fever.  HENT:  Negative for congestion, ear pain, rhinorrhea, sneezing and sore throat.   Eyes: Negative.  Negative for pain, redness and visual disturbance.  Respiratory:  Negative for cough, shortness of breath and wheezing.   Cardiovascular:  Negative for chest pain, palpitations and leg swelling.  Gastrointestinal:  Negative for abdominal pain, blood in stool, constipation, diarrhea, nausea and vomiting.  Endocrine: Negative for cold intolerance, heat intolerance, polydipsia and polyphagia.  Genitourinary: Negative.  Negative for dysuria, flank pain, hematuria, pelvic pain, vaginal bleeding and vaginal discharge.  Musculoskeletal:  Negative for arthralgias, back pain, gait problem and joint swelling.  Skin:  Negative for rash.  Neurological: Negative.  Negative for dizziness, tremors, seizures, weakness, light-headedness, numbness and headaches.  Hematological:  Negative for adenopathy.  Psychiatric/Behavioral: Negative.  Negative for behavioral problems, confusion and dysphoric mood. The patient is not nervous/anxious and is not hyperactive.        Objective    BP 119/79 (BP Location: Left Arm, Patient Position: Sitting, Cuff Size: Normal)   Pulse (!) 103   Temp 98.6 F (37 C) (Oral)   Ht 5' 2.5 (1.588 m)   Wt 144 lb 4.8 oz (65.5 kg)   SpO2 100%   BMI 25.97 kg/m    Physical Exam Vitals and nursing note reviewed.  Constitutional:      General: She is awake.     Appearance: Normal appearance.  HENT:     Head: Normocephalic and atraumatic.     Right Ear: Tympanic membrane, ear canal and external ear normal.     Left Ear: Tympanic membrane, ear canal and external ear normal.     Nose: Nose normal.     Mouth/Throat:     Mouth: Mucous membranes are moist.     Pharynx: Oropharynx is clear. No oropharyngeal exudate or posterior oropharyngeal erythema.   Eyes:     General:  No scleral icterus.    Extraocular Movements: Extraocular movements intact.     Conjunctiva/sclera: Conjunctivae normal.     Pupils: Pupils are equal, round, and reactive to light.  Neck:     Thyroid: No thyromegaly or thyroid tenderness.  Cardiovascular:     Rate and Rhythm: Normal rate and regular rhythm.     Pulses: Normal pulses.     Heart sounds: Normal heart sounds.  Pulmonary:     Effort: Pulmonary effort is normal. No tachypnea, bradypnea or respiratory distress.     Breath sounds: Normal breath sounds. No stridor. No wheezing, rhonchi or rales.  Abdominal:     General: Bowel sounds are normal. There is no distension.     Palpations: Abdomen is soft. There is no mass.     Tenderness: There is no abdominal tenderness. There is no guarding.     Hernia: No hernia is present.  Musculoskeletal:     Cervical back: Normal range of motion and neck supple.     Right lower leg: No edema.     Left lower leg: No edema.  Lymphadenopathy:     Cervical: No cervical adenopathy.  Skin:    General: Skin is warm and dry.  Neurological:     Mental Status: She is alert and oriented to person, place, and time. Mental status is at baseline.  Psychiatric:        Mood and Affect: Mood normal.        Behavior: Behavior normal.      Last depression screening scores    07/03/2024    1:28 PM 12/08/2023    8:10 AM 06/08/2023    8:41 AM  PHQ 2/9 Scores  PHQ - 2 Score 0 0 0  PHQ- 9 Score 0 0 0   Last fall risk screening    07/03/2024    1:17 PM  Fall Risk   Falls in the past year? 0  Number falls in past yr: 0  Injury with Fall? 0  Risk for fall due to : No Fall Risks  Follow up Falls evaluation completed   Last Audit-C alcohol use screening    06/29/2024   10:16 AM  Alcohol Use Disorder Test (AUDIT)  1. How often do you have a drink containing alcohol? 1  2. How many drinks containing alcohol do you have on a typical day when you are drinking? 0  3. How often  do you have six or more drinks on one occasion? 0  AUDIT-C Score 1      Patient-reported   A score of 3 or more in women, and 4 or more in men indicates increased risk for alcohol abuse, EXCEPT if all of the points are from question 1   No results found for any visits on 07/03/24.  Assessment & Plan    Routine Health Maintenance and Physical Exam  Exercise Activities and Dietary recommendations  Goals       continue to not smoke (pt-stated)        Immunization History  Administered Date(s) Administered   Influenza, Quadrivalent, Recombinant, Inj, Pf 07/29/2021   Influenza,inj,Quad PF,6+ Mos 08/09/2020   Influenza-Unspecified 06/05/2019, 06/03/2022, 06/26/2024   Moderna Covid-19 Fall Seasonal Vaccine 24yrs & older 06/26/2024   Moderna Sars-Covid-2 Vaccination 08/09/2020   PFIZER(Purple Top)SARS-COV-2 Vaccination 12/03/2019, 12/24/2019   Pfizer(Comirnaty)Fall Seasonal Vaccine 12 years and older 06/28/2022   Tdap 12/06/2006, 02/06/2020    Health Maintenance  Topic Date Due   Lung Cancer Screening  Never  done   Pneumococcal Vaccine: 50+ Years (1 of 1 - PCV) 04/29/2025 (Originally 08/24/2007)   Zoster Vaccines- Shingrix (2 of 2) 08/21/2024   COVID-19 Vaccine (7 - Mixed Product risk 2025-26 season) 12/25/2024   DEXA SCAN  04/10/2025   Mammogram  05/08/2025   Medicare Annual Wellness (AWV)  07/03/2025   Colonoscopy  12/25/2026   DTaP/Tdap/Td (3 - Td or Tdap) 02/05/2030   Influenza Vaccine  Completed   Hepatitis C Screening  Completed   Meningococcal B Vaccine  Aged Out    Discussed health benefits of physical activity, and encouraged her to engage in regular exercise appropriate for her age and condition.   Annual physical exam  Lung cancer screening declined by patient  Dyspnea on exertion -     Albuterol  Sulfate HFA; Inhale 2 puffs into the lungs every 6 (six) hours as needed for wheezing or shortness of breath.  Dispense: 18 each; Refill: 3  Encounter for smoking  cessation counseling -     buPROPion  HCl ER (XL); Take 1 tablet (150 mg total) by mouth daily.  Dispense: 90 tablet; Refill: 2  Cigarette nicotine dependence in remission -     buPROPion  HCl ER (XL); Take 1 tablet (150 mg total) by mouth daily.  Dispense: 90 tablet; Refill: 2  Chronic kidney disease, stage 2, mildly decreased GFR -     Microalbumin / creatinine urine ratio -     Comprehensive metabolic panel with GFR  Age-related osteoporosis without current pathological fracture -     Comprehensive metabolic panel with GFR -     VITAMIN D  25 Hydroxy (Vit-D Deficiency, Fractures)  Prediabetes -     Hemoglobin A1c  Other hyperlipidemia -     Lipid panel      Annual physical exam Routine wellness visit. Physical exam overall unremarkable except as noted above. Routine lab work ordered as noted.  Recent illness resolved. Eligible for lung cancer screening due to smoking history. - Continue regular exercise and healthy lifestyle. - Consider low-dose lung cancer screening CT scan if she decides to proceed. - Refer to pulmonology for lung cancer screening if she decides to proceed.  Nicotine dependence, cigarettes, in remission and tobacco abuse counseling Nicotine dependence in remission with occasional slips. Discussed importance of continued abstinence and potential benefits of lung cancer screening. - Encouraged continued abstinence from smoking. - Discussed lung cancer screening options.  Chronic kidney disease, stage 2 Continue to optimize risk factors.  Age-related osteoporosis without current pathological fracture Ongoing management with upcoming endocrinology follow-up. - Ordered CMP and vitamin D  level. - Encouraged calcium  and vitamin D  supplementation per endocrinology recommendations. - Receiving Prolia  through endocrinology; defer to specialist management  Prediabetes Minor recent elevations in hemoglobin A1c indicating prediabetes. Discussed lifestyle  modifications to prevent progression. - Encouraged regular exercise and dietary modifications to reduce simple sugar intake.  Other hyperlipidemia Hyperlipidemia managed with atorvastatin . - Continue atorvastatin  20 mg daily as prescribed.  History of ankle fracture with hardware Ankle fracture with hardware. Reports stiffness in cold weather and numbness around scar.    Return in about 6 months (around 12/31/2024) for Chronic f/u.     I discussed the assessment and treatment plan with the patient  The patient was provided an opportunity to ask questions and all were answered. The patient agreed with the plan and demonstrated an understanding of the instructions.   The patient was advised to call back or seek an in-person evaluation if the symptoms worsen or if the  condition fails to improve as anticipated.    LAURAINE LOISE BUOY, DO  [facility] [phone] (phone) [phone] (fax)  [facility]

## 2024-07-03 NOTE — Patient Instructions (Signed)
 Recommend low-dose lung cancer screening - please let us  know if you decide to pursue it and we'll be happy to refer you!

## 2024-07-03 NOTE — Progress Notes (Signed)
 Visit Complete: Virtual I connected with this patient by a audio and video enabled telemedicine application and verified that I am speaking with the correct person using two identifiers.  Patient Location: Home Provider Location: Home Office  I discussed the limitations of evaluation and management by telemedicine. The patient expressed understanding and agreed to proceed.  Persons Participating in Visit: Patient   Subjective:   Terri Weaver is a 68 y.o. female who presents for a Medicare Annual Wellness Visit.  Allergies (verified) Chantix  [varenicline] and Penicillins   History: Past Medical History:  Diagnosis Date   Allergy    Pcn   Anxiety 2 1/2 yrs ago   Hyperlipidemia    8 months ago   Tuberculosis 1993   Past Surgical History:  Procedure Laterality Date   ABDOMINAL HYSTERECTOMY     COLONOSCOPY WITH PROPOFOL  N/A 04/26/2018   Procedure: COLONOSCOPY WITH PROPOFOL ;  Surgeon: Unk Corinn Skiff, MD;  Location: [facility] ENDOSCOPY;  Service: Gastroenterology;  Laterality: N/A;   COLONOSCOPY WITH PROPOFOL  N/A 12/24/2021   Procedure: COLONOSCOPY WITH PROPOFOL ;  Surgeon: Unk Corinn Skiff, MD;  Location: [facility] ENDOSCOPY;  Service: Gastroenterology;  Laterality: N/A;   COSMETIC SURGERY  11/13/2023   Laser lipo and excess skin removal   FRACTURE SURGERY  07/2018   Ankle fx   ORIF ANKLE FRACTURE Right 07/31/2018   Procedure: OPEN REDUCTION INTERNAL FIXATION (ORIF) ANKLE FRACTURE;  Surgeon: Edie Norleen PARAS, MD;  Location: [facility] ORS;  Service: Orthopedics;  Laterality: Right;   TONSILLECTOMY     Family History  Problem Relation Age of Onset   Stomach cancer Mother    Cancer Mother    Heart disease Mother    Prostate cancer Father    COPD Father    Heart attack Sister    Heart disease Sister    Hypertension Sister    Breast cancer Maternal Aunt    Social History   Occupational History   Occupation: retired  Tobacco Use   Smoking status: Former    Current  packs/day: 0.00    Average packs/day: 0.5 packs/day for 47.1 years (23.6 ttl pk-yrs)    Types: Cigarettes    Start date: 52    Quit date: 10/17/2023    Years since quitting: 0.7   Smokeless tobacco: Never   Tobacco comments:    Cut down from 1 pack daily to 1/2 pack a week  Vaping Use   Vaping status: Never Used  Substance and Sexual Activity   Alcohol use: Yes    Comment: 1-2 drinks 3 times per years on special occassions   Drug use: No   Sexual activity: Yes    Partners: Male    Birth control/protection: Post-menopausal, Other-see comments    Comment: Had hysterectomy   Tobacco Counseling Counseling given: Not Answered Tobacco comments: Cut down from 1 pack daily to 1/2 pack a week  SDOH Screenings   Food Insecurity: No Food Insecurity (07/03/2024)  Housing: Low Risk  (07/03/2024)  Transportation Needs: No Transportation Needs (07/03/2024)  Utilities: Not At Risk (07/03/2024)  Alcohol Screen: Low Risk  (06/29/2024)  Depression (PHQ2-9): Low Risk  (07/03/2024)  Financial Resource Strain: Low Risk  (06/29/2024)  Physical Activity: Insufficiently Active (07/03/2024)  Social Connections: Socially Integrated (07/03/2024)  Stress: No Stress Concern Present (07/03/2024)  Tobacco Use: Medium Risk (07/03/2024)  Health Literacy: Adequate Health Literacy (07/03/2024)   Depression Screen    07/03/2024    1:28 PM 12/08/2023    8:10 AM 06/08/2023  8:41 AM 01/10/2023    8:30 AM 06/06/2022    8:58 AM 12/03/2021    8:23 AM 10/19/2021    8:27 AM  PHQ 2/9 Scores  PHQ - 2 Score 0 0 0 0 0 0 0  PHQ- 9 Score 0 0 0 0 1 0 0     Goals Addressed               This Visit's Progress     continue to not smoke (pt-stated)         Visit info / Clinical Intake: Medicare Wellness Visit Type:: Subsequent Annual Wellness Visit Medicare Wellness Visit Mode:: Video If telephone or video:: pt reported vitals Interpreter Needed?: No Pre-visit prep was completed: yes AWV questionnaire completed by  patient prior to visit?: yes Date:: 06/29/24 Living arrangements:: lives with spouse/significant other Patient's Overall Health Status Rating: very good Typical amount of pain: none Does pain affect daily life?: no Are you currently prescribed opioids?: no  Dietary Habits and Nutritional Risks How many meals a day?: 2 Eats fruit and vegetables daily?: yes Most meals are obtained by: preparing own meals In the last 2 weeks, have you had any of the following?: -- (no) Diabetic:: no  Functional Status Activities of Daily Living (to include ambulation/medication): Independent Ambulation: Independent with device- listed below Home Assistive Devices/Equipment: Eyeglasses Medication Administration: Independent Home Management: Independent Manage your own finances?: yes Primary transportation is: driving Concerns about vision?: no *vision screening is required for WTM* Concerns about hearing?: no  Fall Screening Falls in the past year?: 0 Number of falls in past year: 0 Was there an injury with Fall?: 0 Fall Risk Category Calculator: 0 Patient Fall Risk Level: Low Fall Risk  Fall Risk Patient at Risk for Falls Due to: No Fall Risks Fall risk Follow up: Falls evaluation completed  Home and Transportation Safety: All rugs have non-skid backing?: N/A, no rugs All stairs or steps have railings?: yes Grab bars in the bathtub or shower?: (!) no Have non-skid surface in bathtub or shower?: yes Good home lighting?: yes Regular seat belt use?: yes Hospital stays in the last year:: no  Cognitive Assessment Difficulty concentrating, remembering, or making decisions? : no Will 6CIT or Mini Cog be Completed: yes What year is it?: 0 points What month is it?: 0 points Give patient an address phrase to remember (5 components): [address] [place] About what time is it?: 0 points Count backwards from 20 to 1: 0 points Say the months of the year in reverse: 0 points Repeat the  address phrase from earlier: 0 points 6 CIT Score: 0 points  Advance Directives (For Healthcare) Does Patient Have a Medical Advance Directive?: No Would patient like information on creating a medical advance directive?: Yes (MAU/Ambulatory/Procedural Areas - Information given)  Reviewed/Updated  Reviewed/Updated: All        Objective:    Today's Vitals   07/03/24 1308  Weight: 141 lb (64 kg)  Height: 5' 2.5 (1.588 m)   Body mass index is 25.38 kg/m.  Current Medications (verified) Outpatient Encounter Medications as of 07/03/2024  Medication Sig   albuterol  (VENTOLIN  HFA) 108 (90 Base) MCG/ACT inhaler TAKE 2 PUFFS BY MOUTH EVERY 6 HOURS AS NEEDED FOR WHEEZE OR SHORTNESS OF BREATH   atorvastatin  (LIPITOR) 20 MG tablet Take 1 tablet (20 mg total) by mouth daily.   BIOTIN  PO Take 1 tablet by mouth daily.    buPROPion  (WELLBUTRIN  SR) 150 MG 12 hr tablet TAKE  1 TABLET TWICE DAILY, WITH AT LEAST 8 HOURS BETWEEN DOSES   Calcium -Vitamin D -Vitamin K  500-100-40 MG-UNT-MCG CHEW Chew 1 tablet by mouth daily.    COLLAGEN PO Take 1 tablet by mouth daily.  (Patient taking differently: Take 1 tablet by mouth daily. Taking twice a day)   denosumab  (PROLIA ) 60 MG/ML SOSY injection Inject 60 mg into the skin every 6 (six) months.   escitalopram  (LEXAPRO ) 10 MG tablet TAKE 1 TABLET BY MOUTH EVERY DAY   KRILL OIL PO Take by mouth.   METAMUCIL FIBER PO Take by mouth.   OVER THE COUNTER MEDICATION Take 1 tablet by mouth daily. Preservision Areds 2: Take 2 tablets daily by mouth   No facility-administered encounter medications on file as of 07/03/2024.   Hearing/Vision screen Hearing Screening - Comments:: Denies hearing loss  Vision Screening - Comments:: UTD @ Dr. Mittie Jacobs [place] Immunizations and Health Maintenance Health Maintenance  Topic Date Due   Zoster Vaccines- Shingrix (1 of 2) Never done   Lung Cancer Screening  Never done   COVID-19 Vaccine (5 - 2025-26 season)  04/29/2024   Pneumococcal Vaccine: 50+ Years (1 of 1 - PCV) 04/29/2025 (Originally 08/24/2007)   DEXA SCAN  04/10/2025   Mammogram  05/08/2025   Medicare Annual Wellness (AWV)  07/03/2025   Colonoscopy  12/25/2026   DTaP/Tdap/Td (3 - Td or Tdap) 02/05/2030   Influenza Vaccine  Completed   Hepatitis C Screening  Completed   Meningococcal B Vaccine  Aged Out        Assessment/Plan:  This is a routine wellness examination for Terri Weaver.  Patient Care Team: Donzella Lauraine SAILOR, DO as PCP - General (Family Medicine) Mittie Gaskin, MD as Referring Physician (Ophthalmology) Brendia Calton Squires, [place] (Endocrinology)  I have personally reviewed and noted the following in the patient's chart:   Medical and social history Use of alcohol, tobacco or illicit drugs  Current medications and supplements including opioid prescriptions. Functional ability and status Nutritional status Physical activity Advanced directives List of other physicians Hospitalizations, surgeries, and ER visits in previous 12 months Vitals Screenings to include cognitive, depression, and falls Referrals and appointments  No orders of the defined types were placed in this encounter.  In addition, I have reviewed and discussed with patient certain preventive protocols, quality metrics, and best practice recommendations. A written personalized care plan for preventive services as well as general preventive health recommendations were provided to patient.   Terri Weaver, CMA   07/03/2024   Return in 1 year (on 07/03/2025).  After Visit Summary: (MyChart) Due to this being a telephonic visit, the after visit summary with patients personalized plan was offered to patient via MyChart   Nurse Notes:  Received flu, covid and 1st Shingrix vaccine at [facility] on 06/26/24. Declined pneumonia vaccine

## 2024-07-04 LAB — LIPID PANEL
Chol/HDL Ratio: 3.1 ratio (ref 0.0–4.4)
Cholesterol, Total: 171 mg/dL (ref 100–199)
HDL: 56 mg/dL (ref >39)
LDL Chol Calc (NIH): 97 mg/dL (ref 0–99)
Triglycerides: 100 mg/dL (ref 0–149)
VLDL Cholesterol Cal: 18 mg/dL (ref 5–40)

## 2024-07-04 LAB — COMPREHENSIVE METABOLIC PANEL WITH GFR
ALT: 18 IU/L (ref 0–32)
AST: 24 IU/L (ref 0–40)
Albumin: 4.6 g/dL (ref 3.9–4.9)
Alkaline Phosphatase: 62 IU/L (ref 49–135)
BUN/Creatinine Ratio: 19 (ref 12–28)
BUN: 13 mg/dL (ref 8–27)
Bilirubin Total: 0.5 mg/dL (ref 0.0–1.2)
CO2: 26 mmol/L (ref 20–29)
Calcium: 9.6 mg/dL (ref 8.7–10.3)
Chloride: 102 mmol/L (ref 96–106)
Creatinine, Ser: 0.67 mg/dL (ref 0.57–1.00)
Globulin, Total: 2.6 g/dL (ref 1.5–4.5)
Glucose: 93 mg/dL (ref 70–99)
Potassium: 4.4 mmol/L (ref 3.5–5.2)
Sodium: 144 mmol/L (ref 134–144)
Total Protein: 7.2 g/dL (ref 6.0–8.5)
eGFR: 96 mL/min/1.73 (ref >59)

## 2024-07-04 LAB — MICROALBUMIN / CREATININE URINE RATIO
Creatinine, Urine: 28.9 mg/dL
Microalb/Creat Ratio: <10 mg/g{creat} (ref 0–29)
Microalbumin, Urine: <3 ug/mL

## 2024-07-04 LAB — HEMOGLOBIN A1C
Est. average glucose Bld gHb Est-mCnc: 120 mg/dL
Hgb A1c MFr Bld: 5.8 % — ABNORMAL HIGH (ref 4.8–5.6)

## 2024-07-04 LAB — VITAMIN D 25 HYDROXY (VIT D DEFICIENCY, FRACTURES): Vit D, 25-Hydroxy: 29.5 ng/mL — ABNORMAL LOW (ref 30.0–100.0)

## 2024-07-13 ENCOUNTER — Ambulatory Visit: Payer: Self-pay | Admitting: Family Medicine

## 2024-07-26 ENCOUNTER — Telehealth: Payer: Self-pay

## 2024-12-31 ENCOUNTER — Ambulatory Visit: Admitting: Family Medicine

## 2025-07-08 ENCOUNTER — Ambulatory Visit
